# Patient Record
Sex: Female | Born: 1955 | Race: White | Hispanic: No | Marital: Married | Smoking: Never smoker
Health system: Southern US, Community
[De-identification: ages and names within clinical notes are randomized; demographics above are authoritative.]

## PROBLEM LIST (undated history)

## (undated) DIAGNOSIS — T7840XA Allergy, unspecified, initial encounter: Secondary | ICD-10-CM

## (undated) DIAGNOSIS — I011 Acute rheumatic endocarditis: Secondary | ICD-10-CM

## (undated) DIAGNOSIS — M810 Age-related osteoporosis without current pathological fracture: Secondary | ICD-10-CM

## (undated) DIAGNOSIS — L52 Erythema nodosum: Secondary | ICD-10-CM

## (undated) DIAGNOSIS — M1990 Unspecified osteoarthritis, unspecified site: Secondary | ICD-10-CM

## (undated) HISTORY — DX: Unspecified osteoarthritis, unspecified site: M19.90

## (undated) HISTORY — DX: Acute rheumatic endocarditis: I01.1

## (undated) HISTORY — PX: TUBAL LIGATION: SHX77

## (undated) HISTORY — DX: Age-related osteoporosis without current pathological fracture: M81.0

## (undated) HISTORY — DX: Erythema nodosum: L52

## (undated) HISTORY — DX: Allergy, unspecified, initial encounter: T78.40XA

---
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
## 1979-07-28 DIAGNOSIS — L52 Erythema nodosum: Secondary | ICD-10-CM

## 1979-07-28 HISTORY — DX: Erythema nodosum: L52

## 2001-11-26 HISTORY — PX: CERVICAL POLYPECTOMY: SHX88

## 2002-02-10 ENCOUNTER — Other Ambulatory Visit: Admission: RE | Admit: 2002-02-10 | Discharge: 2002-02-10 | Payer: Self-pay | Admitting: *Deleted

## 2005-10-09 ENCOUNTER — Other Ambulatory Visit: Admission: RE | Admit: 2005-10-09 | Discharge: 2005-10-09 | Payer: Self-pay | Admitting: Obstetrics and Gynecology

## 2006-01-03 ENCOUNTER — Encounter: Admission: RE | Admit: 2006-01-03 | Discharge: 2006-01-03 | Payer: Self-pay | Admitting: Gastroenterology

## 2006-10-31 ENCOUNTER — Other Ambulatory Visit: Admission: RE | Admit: 2006-10-31 | Discharge: 2006-10-31 | Payer: Self-pay | Admitting: Obstetrics & Gynecology

## 2009-08-10 ENCOUNTER — Encounter: Admission: RE | Admit: 2009-08-10 | Discharge: 2009-08-10 | Payer: Self-pay | Admitting: Obstetrics & Gynecology

## 2009-08-17 ENCOUNTER — Emergency Department (HOSPITAL_COMMUNITY): Admission: EM | Admit: 2009-08-17 | Discharge: 2009-08-17 | Payer: Self-pay | Admitting: Emergency Medicine

## 2012-10-17 ENCOUNTER — Other Ambulatory Visit: Payer: Self-pay | Admitting: Obstetrics & Gynecology

## 2012-10-17 DIAGNOSIS — Z1231 Encounter for screening mammogram for malignant neoplasm of breast: Secondary | ICD-10-CM

## 2012-12-03 ENCOUNTER — Ambulatory Visit
Admission: RE | Admit: 2012-12-03 | Discharge: 2012-12-03 | Disposition: A | Discharge status: Home / Self Care | Payer: Commercial Managed Care - PPO | Source: Ambulatory Visit | Attending: Obstetrics & Gynecology | Admitting: Obstetrics & Gynecology

## 2012-12-03 DIAGNOSIS — Z1231 Encounter for screening mammogram for malignant neoplasm of breast: Secondary | ICD-10-CM

## 2013-04-09 ENCOUNTER — Encounter: Payer: Self-pay | Admitting: Nurse Practitioner

## 2013-04-09 ENCOUNTER — Telehealth: Payer: Self-pay | Admitting: Nurse Practitioner

## 2013-04-09 ENCOUNTER — Ambulatory Visit (INDEPENDENT_AMBULATORY_CARE_PROVIDER_SITE_OTHER): Payer: Commercial Managed Care - PPO | Admitting: Nurse Practitioner

## 2013-04-09 ENCOUNTER — Encounter: Payer: Self-pay | Admitting: *Deleted

## 2013-04-09 VITALS — BP 128/70 | HR 76 | Temp 97.9°F | Wt 146.0 lb

## 2013-04-09 DIAGNOSIS — N390 Urinary tract infection, site not specified: Secondary | ICD-10-CM

## 2013-04-09 DIAGNOSIS — N952 Postmenopausal atrophic vaginitis: Secondary | ICD-10-CM

## 2013-04-09 LAB — POCT URINALYSIS DIPSTICK
Leukocytes, UA: NEGATIVE
Urobilinogen, UA: NEGATIVE
pH, UA: 5.5

## 2013-04-09 MED ORDER — ESTRADIOL 0.1 MG/GM VA CREA: TOPICAL_CREAM | VAGINAL | Status: DC

## 2013-04-09 MED ORDER — SULFAMETHOXAZOLE-TRIMETHOPRIM 800-160 MG PO TABS: 1.0000 | ORAL_TABLET | Freq: Two times a day (BID) | ORAL | Status: DC

## 2013-04-09 NOTE — Telephone Encounter (Signed)
[facility] pharmacy called in regards to discrepancies with the directions; particularly dosages needed for the prescription. The inquiry pertains to a prescription written for patient Jill Lara. The provider for this patient is Patty.

## 2013-04-09 NOTE — Patient Instructions (Signed)
Urinary Tract Infection Urinary tract infections (UTIs) can develop anywhere along your urinary tract. Your urinary tract is your body's drainage system for removing wastes and extra water. Your urinary tract includes two kidneys, two ureters, a bladder, and a urethra. Your kidneys are a pair of bean-shaped organs. Each kidney is about the size of your fist. They are located below your ribs, one on each side of your spine. CAUSES Infections are caused by microbes, which are microscopic organisms, including fungi, viruses, and bacteria. These organisms are so small that they can only be seen through a microscope. Bacteria are the microbes that most commonly cause UTIs. SYMPTOMS  Symptoms of UTIs may vary by age and gender of the patient and by the location of the infection. Symptoms in young women typically include a frequent and intense urge to urinate and a painful, burning feeling in the bladder or urethra during urination. Older women and men are more likely to be tired, shaky, and weak and have muscle aches and abdominal pain. A fever may mean the infection is in your kidneys. Other symptoms of a kidney infection include pain in your back or sides below the ribs, nausea, and vomiting. DIAGNOSIS To diagnose a UTI, your caregiver will ask you about your symptoms. Your caregiver also will ask to provide a urine sample. The urine sample will be tested for bacteria and white blood cells. White blood cells are made by your body to help fight infection. TREATMENT  Typically, UTIs can be treated with medication. Because most UTIs are caused by a bacterial infection, they usually can be treated with the use of antibiotics. The choice of antibiotic and length of treatment depend on your symptoms and the type of bacteria causing your infection. HOME CARE INSTRUCTIONS  If you were prescribed antibiotics, take them exactly as your caregiver instructs you. Finish the medication even if you feel better after you  have only taken some of the medication.  Drink enough water and fluids to keep your urine clear or pale yellow.  Avoid caffeine, tea, and carbonated beverages. They tend to irritate your bladder.  Empty your bladder often. Avoid holding urine for long periods of time.  Empty your bladder before and after sexual intercourse.  After a bowel movement, women should cleanse from front to back. Use each tissue only once. SEEK MEDICAL CARE IF:   You have back pain.  You develop a fever.  Your symptoms do not begin to resolve within 3 days. SEEK IMMEDIATE MEDICAL CARE IF:   You have severe back pain or lower abdominal pain.  You develop chills.  You have nausea or vomiting.  You have continued burning or discomfort with urination. MAKE SURE YOU:   Understand these instructions.  Will watch your condition.  Will get help right away if you are not doing well or get worse. Document Released: 08/22/2005 Document Revised: 05/13/2012 Document Reviewed: 12/21/2011 Exit[employer] Patient Information 2013 [employer].  

## 2013-04-09 NOTE — Progress Notes (Signed)
Subjective:     Patient ID: Jill Lara, female   DOB: [birth year], 57 y.o.   MRN: [id number]  Pelvic Pain The patient's primary symptoms include pelvic pain. Primary symptoms comment: Urinary dysuria and lower abdominal pressure since yesterday.. The current episode started yesterday. Associated symptoms include dysuria and frequency. Pertinent negatives include no chills, constipation, diarrhea or fever. Associated symptoms comments: Past week some loose stools but not diarrhea. Last sexually active 3-4 days ago. Recent increase in vaginal dryness with dyspareunia. Uses Estrace vaginal cream prn but currently out of med's.. She has tried NSAIDs for the symptoms. She is sexually active. She is postmenopausal.     Review of Systems  Constitutional: Negative.  Negative for fever and chills.  Respiratory:       Recent URI   Cardiovascular: Negative.   Gastrointestinal: Negative.  Negative for diarrhea and constipation.  Genitourinary: Positive for dysuria, frequency and pelvic pain.       No vaginal discharge.  Most recently out of vaginal estrogen cream.  Request a new RX.  Musculoskeletal: Negative.   Skin: Negative.   Neurological: Negative.   Psychiatric/Behavioral: Negative.        Objective:   Physical Exam  Constitutional: She is oriented to person, place, and time. She appears well-developed and well-nourished.  Cardiovascular: Normal rate.   Pulmonary/Chest: Effort normal.  Abdominal: Soft. She exhibits no distension and no mass. There is no tenderness. There is no rebound and no guarding.  Genitourinary:  Denies vaginal symptoms or discharge.  Neurological: She is alert and oriented to person, place, and time.  Skin: Skin is warm and dry.  Psychiatric: She has a normal mood and affect. Her behavior is normal. Judgment and thought content normal.   Urine is cloudy, otherwise negative    Assessment:     R/O UTI History of allergy to Macrobid and Cipro History of  atrophic vaginitis    Plan:     Septra DS 1 tab bid for 1 week. If culture is positive - return of TOC Refill Estrace  Vaginal cream to restart at 1 gm 2 X week, then may reduce to 1/2 gm 2 x week.

## 2013-04-12 LAB — URINE CULTURE: Colony Count: >=100000

## 2013-04-13 ENCOUNTER — Telehealth: Payer: Self-pay | Admitting: *Deleted

## 2013-04-13 NOTE — Telephone Encounter (Signed)
Message copied by Osie Bond on Mon Apr 13, 2013  4:50 PM ------      Message from: Ria Comment R      Created: Mon Apr 13, 2013  8:20 AM       Let patient know that she is on the correct antibiotic and needs TOC in 2 wks. Lab visit only is OK for her. ------

## 2013-04-13 NOTE — Telephone Encounter (Signed)
[facility] called and given correct quantity of prescription for Bactrim one tablet po BID x 7 days per Ulice Bold, FNP

## 2013-04-13 NOTE — Telephone Encounter (Signed)
Pt is aware of urine culture results and has TOC appt on 05/01/2013.

## 2013-04-13 NOTE — Progress Notes (Signed)
Encounter reviewed by Dr. Brook Silva.  

## 2013-05-01 ENCOUNTER — Other Ambulatory Visit: Payer: Self-pay | Admitting: Nurse Practitioner

## 2013-05-01 ENCOUNTER — Other Ambulatory Visit (INDEPENDENT_AMBULATORY_CARE_PROVIDER_SITE_OTHER): Payer: Commercial Managed Care - PPO

## 2013-05-01 DIAGNOSIS — N390 Urinary tract infection, site not specified: Secondary | ICD-10-CM

## 2013-05-03 LAB — URINE CULTURE

## 2013-05-04 ENCOUNTER — Telehealth: Payer: Self-pay | Admitting: *Deleted

## 2013-05-04 NOTE — Telephone Encounter (Signed)
Message copied by Osie Bond on Mon May 04, 2013  9:47 AM ------      Message from: Ria Comment R      Created: Mon May 04, 2013  8:25 AM       Let patient know negative.  If better she can stop Septra. Does not need TOC ------

## 2013-05-04 NOTE — Telephone Encounter (Signed)
Pt is aware of negative urine culture results and states she is currently not having any symptoms.

## 2013-10-12 ENCOUNTER — Ambulatory Visit: Payer: Self-pay | Admitting: Nurse Practitioner

## 2013-11-04 ENCOUNTER — Ambulatory Visit: Payer: Commercial Managed Care - PPO | Admitting: Nurse Practitioner

## 2013-12-22 ENCOUNTER — Ambulatory Visit: Payer: Self-pay

## 2013-12-22 ENCOUNTER — Ambulatory Visit (HOSPITAL_COMMUNITY)
Admission: RE | Admit: 2013-12-22 | Discharge: 2013-12-22 | Disposition: A | Discharge status: Home / Self Care | Payer: Self-pay | Source: Ambulatory Visit | Attending: Family Medicine | Admitting: Family Medicine

## 2013-12-22 ENCOUNTER — Ambulatory Visit: Payer: Self-pay | Admitting: Family Medicine

## 2013-12-22 VITALS — BP 122/76 | HR 78 | Temp 99.3°F | Resp 20 | Ht 67.0 in | Wt 171.0 lb

## 2013-12-22 DIAGNOSIS — R0602 Shortness of breath: Secondary | ICD-10-CM

## 2013-12-22 DIAGNOSIS — R599 Enlarged lymph nodes, unspecified: Secondary | ICD-10-CM | POA: Insufficient documentation

## 2013-12-22 DIAGNOSIS — R0902 Hypoxemia: Secondary | ICD-10-CM

## 2013-12-22 DIAGNOSIS — M549 Dorsalgia, unspecified: Secondary | ICD-10-CM | POA: Insufficient documentation

## 2013-12-22 DIAGNOSIS — J189 Pneumonia, unspecified organism: Secondary | ICD-10-CM | POA: Insufficient documentation

## 2013-12-22 DIAGNOSIS — R059 Cough, unspecified: Secondary | ICD-10-CM

## 2013-12-22 DIAGNOSIS — J90 Pleural effusion, not elsewhere classified: Secondary | ICD-10-CM | POA: Insufficient documentation

## 2013-12-22 DIAGNOSIS — R091 Pleurisy: Secondary | ICD-10-CM

## 2013-12-22 DIAGNOSIS — R05 Cough: Secondary | ICD-10-CM

## 2013-12-22 LAB — POCT CBC
GRANULOCYTE PERCENT: 75.3 % (ref 37–80)
HEMATOCRIT: 42.8 % (ref 37.7–47.9)
HEMOGLOBIN: 13.6 g/dL (ref 12.2–16.2)
Lymph, poc: 1.7 (ref 0.6–3.4)
MCH, POC: 28.8 pg (ref 27–31.2)
MCHC: 31.8 g/dL (ref 31.8–35.4)
MCV: 90.6 fL (ref 80–97)
MID (cbc): 0.7 (ref 0–0.9)
MPV: 8.3 fL (ref 0–99.8)
POC GRANULOCYTE: 7.1 — AB (ref 2–6.9)
POC LYMPH %: 17.7 % (ref 10–50)
POC MID %: 7 % (ref 0–12)
Platelet Count, POC: 370 10*3/uL (ref 142–424)
RBC: 4.72 M/uL (ref 4.04–5.48)
RDW, POC: 12.9 %
WBC: 9.4 10*3/uL (ref 4.6–10.2)

## 2013-12-22 MED ORDER — HYDROCODONE-ACETAMINOPHEN 10-325 MG PO TABS: 1.0000 | ORAL_TABLET | Freq: Three times a day (TID) | ORAL | Status: DC | PRN

## 2013-12-22 MED ORDER — INDOMETHACIN 25 MG PO CAPS: 25.0000 mg | ORAL_CAPSULE | Freq: Three times a day (TID) | ORAL | Status: DC

## 2013-12-22 MED ORDER — AZITHROMYCIN 250 MG PO TABS: ORAL_TABLET | ORAL | Status: DC

## 2013-12-22 MED ORDER — IOHEXOL 350 MG/ML SOLN: 100.0000 mL | Freq: Once | INTRAVENOUS | Status: AC | PRN

## 2013-12-22 NOTE — Progress Notes (Addendum)
Subjective: 58 year old lady who is generally healthy. For 5 days ago she had a little sore throat. She developed some hurting in her chest when she would take a deep breath or cough. It seemed to hurt in the right low back. She may have had a little congestion. Sore throat is gone away. She feels the pain most when she takes a deep breath or coughs, it just catches her in the right mid back. She does not smoke. She is not coughing up anything major. No nausea or vomiting. No calf pain. No chest or abdominal surgeries. She did not get a flu shot this year. No history of chest wall trauma  Objective: Pleasant healthy-appearing lady in no major distress when she is sitting still. TMs normal. Throat clear. Neck supple without nodes. Chest is clear to auscultation but when she tries to take a deep breath she has a catch and does not progress on into a good full deep breath due to the pain in the right lower chest posteriorly. No chest wall tenderness. Possible minimal dullness on percussion. Heart regular without murmurs. Abdomen soft and nontender. No calf tenderness negative Homans.  Assessment: Pleuritic right lower chest pain posteriorly  Plan: Chest x-ray CBC  Results for orders placed in visit on 12/22/13  POCT CBC      Result Value Range   WBC 9.4  4.6 - 10.2 K/uL   Lymph, poc 1.7  0.6 - 3.4   POC LYMPH PERCENT 17.7  10 - 50 %L   MID (cbc) 0.7  0 - 0.9   POC MID % 7.0  0 - 12 %M   POC Granulocyte 7.1 (*) 2 - 6.9   Granulocyte percent 75.3  37 - 80 %G   RBC 4.72  4.04 - 5.48 M/uL   Hemoglobin 13.6  12.2 - 16.2 g/dL   HCT, POC 42.8  37.7 - 47.9 %   MCV 90.6  80 - 97 fL   MCH, POC 28.8  27 - 31.2 pg   MCHC 31.8  31.8 - 35.4 g/dL   RDW, POC 12.9     Platelet Count, POC 370  142 - 424 K/uL   MPV 8.3  0 - 99.8 fL   [facility] reading (PRIMARY) by  Dr. Rise Paganini area of infiltrate right upper lung central Prominent markings right lower lung Tiny bit of blunting right costophrenic  angle  Will send for lung scan to rule out pulmonary embolus with regard to the hypoxemia, infiltrates, and pleural nature of the pain. However her other symptoms sounded like she had a viral infection, and I still suspect is going to turn out to be an acute viral pleurisy..  Lung scan was normal but there are multiple focal areas of pneumonia, more than seen on regular chest x-ray. I decided to change the antibiotic to Levaquin. The patient is supposed come back for a recheck tomorrow. Assistant is to call the patient sensed the radiologist could not put me through to the patient. I think she ended up having to leave a message since the patient's cell phone was not on.

## 2013-12-22 NOTE — Patient Instructions (Addendum)
Lung scan will be done to rule out a blood clot  Take Zithromax as directed  Take indomethacin 25 mg one tablet  3 times daily with food for inflammation  Take hydrocodone for pain every 4 hours as needed  Return for recheck tomorrow morning

## 2013-12-23 ENCOUNTER — Telehealth: Payer: Self-pay

## 2013-12-23 NOTE — Telephone Encounter (Signed)
Patient is not coming in today.  Her medication is working for her.  She wanted Dr. Linna Darner and Morey Hummingbird to know she is fine, and will call back if she backslides.   [phone]

## 2013-12-24 NOTE — Telephone Encounter (Signed)
noted 

## 2013-12-25 ENCOUNTER — Telehealth: Payer: Self-pay

## 2013-12-25 NOTE — Telephone Encounter (Signed)
Advised pt to RTC if symptoms are not improved. Pt agrees to come in for a follow up around Feb 28.

## 2013-12-25 NOTE — Telephone Encounter (Signed)
PATIENT HAS QUESTIONS ABOUT HER CHEST XRAY WOULD LIKE DR HOPPER TO CALL HER AT [phone]

## 2014-01-01 NOTE — Telephone Encounter (Signed)
She does not have an upcoming appointment, she is to see Dr Linna Darner at 102. Called her, what is the reaction? No answer. Please try again later.

## 2014-01-01 NOTE — Telephone Encounter (Signed)
PT HAD AN ADVERSE REACTION TO ANITBIOTICS PRESCRIBED BY DR. HOPPER. SHE WANTED TO SPEAK TO SOMEONE IN REGARDS TO THIS AND AN UPCOMING APPT. THANKS!!

## 2014-01-04 ENCOUNTER — Encounter: Payer: Self-pay | Admitting: Family Medicine

## 2014-01-04 NOTE — Telephone Encounter (Signed)
Spoke with patient, she took 5 days of the levaquin. States she has has what the orthopedic doctor calls an adverse reaction. She has bilateral bicep tears and a tear in left hand. She plans to come back on feb. 28th.

## 2014-01-04 NOTE — Telephone Encounter (Signed)
Patient called me back and we discussed the bilateral shoulder tendinopathy and rupture problems. The orthopedist will follow her. If her pneumonia gives her more trouble she is to come back and see me. She plans to come back and of the month anyhow.

## 2014-01-04 NOTE — Telephone Encounter (Signed)
I called the patient and left a message on her machine. I'm sorry to hear that she has the problems from the tendinopathy caused by the Levaquin.. I've heard of that happening but have not ever had it in my own experience. Told her orthopedist we'll obviously have to be cared for this, but if she needs me to give me a call back. Understand that she is planning to come back in anyhow.

## 2014-01-15 ENCOUNTER — Ambulatory Visit: Payer: Self-pay

## 2014-01-15 ENCOUNTER — Ambulatory Visit: Payer: Self-pay | Admitting: Family Medicine

## 2014-01-15 VITALS — BP 120/78 | HR 121 | Temp 98.0°F | Resp 16 | Ht 65.5 in | Wt 169.2 lb

## 2014-01-15 DIAGNOSIS — J189 Pneumonia, unspecified organism: Secondary | ICD-10-CM

## 2014-01-15 DIAGNOSIS — R05 Cough: Secondary | ICD-10-CM

## 2014-01-15 DIAGNOSIS — M719 Bursopathy, unspecified: Secondary | ICD-10-CM

## 2014-01-15 DIAGNOSIS — R700 Elevated erythrocyte sedimentation rate: Secondary | ICD-10-CM

## 2014-01-15 DIAGNOSIS — R9389 Abnormal findings on diagnostic imaging of other specified body structures: Secondary | ICD-10-CM

## 2014-01-15 DIAGNOSIS — R091 Pleurisy: Secondary | ICD-10-CM

## 2014-01-15 DIAGNOSIS — R0602 Shortness of breath: Secondary | ICD-10-CM

## 2014-01-15 DIAGNOSIS — M6790 Unspecified disorder of synovium and tendon, unspecified site: Secondary | ICD-10-CM

## 2014-01-15 DIAGNOSIS — R059 Cough, unspecified: Secondary | ICD-10-CM

## 2014-01-15 DIAGNOSIS — R0902 Hypoxemia: Secondary | ICD-10-CM

## 2014-01-15 LAB — POCT CBC
GRANULOCYTE PERCENT: 79.2 % (ref 37–80)
HEMATOCRIT: 38.8 % (ref 37.7–47.9)
HEMOGLOBIN: 12.2 g/dL (ref 12.2–16.2)
Lymph, poc: 1.7 (ref 0.6–3.4)
MCH, POC: 27.9 pg (ref 27–31.2)
MCHC: 31.4 g/dL — AB (ref 31.8–35.4)
MCV: 88.9 fL (ref 80–97)
MID (CBC): 0.9 (ref 0–0.9)
MPV: 6.9 fL (ref 0–99.8)
POC GRANULOCYTE: 10 — AB (ref 2–6.9)
POC LYMPH %: 13.3 % (ref 10–50)
POC MID %: 7.5 % (ref 0–12)
Platelet Count, POC: 632 10*3/uL — AB (ref 142–424)
RBC: 4.37 M/uL (ref 4.04–5.48)
RDW, POC: 12.9 %
WBC: 12.6 10*3/uL — AB (ref 4.6–10.2)

## 2014-01-15 LAB — POCT SEDIMENTATION RATE: POCT SED RATE: 116 mm/h — AB (ref 0–22)

## 2014-01-15 MED ORDER — INDOMETHACIN 25 MG PO CAPS: 25.0000 mg | ORAL_CAPSULE | Freq: Three times a day (TID) | ORAL | Status: DC

## 2014-01-15 MED ORDER — AZITHROMYCIN 250 MG PO TABS: ORAL_TABLET | ORAL | Status: DC

## 2014-01-15 NOTE — Progress Notes (Signed)
Subjective:  Jill Lara developed tendinopathy is for 5 days after being placed on the Levaquin. She went to the orthopedic urgent care when the orthopedic doctor made the diagnosis as being Levaquin related. Subsequent to that she is seeing at least one of the orthopedist who didn't think he ever seen that caused by Levaquin. She has tendinopathy in her right shoulder and biceps area in the left shoulder and biceps area has developed a decreased drains contracture in the left hand, and hurts in both hands. Lower extremities are okay. She continues to cough and feel tight in her lungs. The CT scan showed a multifocal pneumonia. She's not been running any fever.  Objective: Pleasant lady accompanied by her her husband her throat is clear. Neck supple without nodes. Chest clear to auscultation. Heart regular without murmurs. Shoulders are both tender down to the biceps areas. Left hand has the complaints contractures noted. Both wrists and painful.  [facility] reading (PRIMARY) by  Dr. Linna Lara Minimal improvement of patchy rul infiltrate.  Right pleural effusion, small  Results for orders placed in visit on 01/15/14  POCT CBC      Result Value Ref Range   WBC 12.6 (*) 4.6 - 10.2 K/uL   Lymph, poc 1.7  0.6 - 3.4   POC LYMPH PERCENT 13.3  10 - 50 %L   MID (cbc) 0.9  0 - 0.9   POC MID % 7.5  0 - 12 %M   POC Granulocyte 10.0 (*) 2 - 6.9   Granulocyte percent 79.2  37 - 80 %G   RBC 4.37  4.04 - 5.48 M/uL   Hemoglobin 12.2  12.2 - 16.2 g/dL   HCT, POC 38.8  37.7 - 47.9 %   MCV 88.9  80 - 97 fL   MCH, POC 27.9  27 - 31.2 pg   MCHC 31.4 (*) 31.8 - 35.4 g/dL   RDW, POC 12.9     Platelet Count, POC 632 (*) 142 - 424 K/uL   MPV 6.9  0 - 99.8 fL   Assessment: Atypical pneumonia 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  Consider pulmonary referral PT referral

## 2014-01-15 NOTE — Patient Instructions (Signed)
Is being made to physical therapy. I spoke to the [place] [facility] physical therapy at [facility] 2:30 PM  Wednesday   Referral will probably be made to rheumatology.  Labs are pending  Return in 10-14 days.

## 2014-01-16 LAB — ANGIOTENSIN CONVERTING ENZYME: Angiotensin-Converting Enzyme: 21 U/L (ref 8–52)

## 2014-01-17 LAB — COMPLETE METABOLIC PANEL WITH GFR
ALK PHOS: 108 U/L (ref 39–117)
ALT: 17 U/L (ref 0–35)
AST: 21 U/L (ref 0–37)
Albumin: 3.7 g/dL (ref 3.5–5.2)
BUN: 19 mg/dL (ref 6–23)
CALCIUM: 9 mg/dL (ref 8.4–10.5)
CO2: 24 meq/L (ref 19–32)
CREATININE: 0.7 mg/dL (ref 0.50–1.10)
Chloride: 101 mEq/L (ref 96–112)
GFR, Est Non African American: >89 mL/min
Glucose, Bld: 106 mg/dL — ABNORMAL HIGH (ref 70–99)
Potassium: 4.8 mEq/L (ref 3.5–5.3)
Sodium: 138 mEq/L (ref 135–145)
TOTAL PROTEIN: 7.3 g/dL (ref 6.0–8.3)
Total Bilirubin: 0.3 mg/dL (ref 0.2–1.2)

## 2014-01-17 NOTE — Addendum Note (Signed)
Addended by: Constance Goltz on: 01/17/2014 10:25 AM   Modules accepted: Orders

## 2014-01-18 LAB — ANA: Anti Nuclear Antibody(ANA): NEGATIVE

## 2014-01-20 ENCOUNTER — Ambulatory Visit: Payer: Self-pay | Attending: Family Medicine | Admitting: Physical Therapy

## 2014-01-20 DIAGNOSIS — IMO0001 Reserved for inherently not codable concepts without codable children: Secondary | ICD-10-CM | POA: Insufficient documentation

## 2014-01-20 DIAGNOSIS — M25519 Pain in unspecified shoulder: Secondary | ICD-10-CM | POA: Insufficient documentation

## 2014-01-21 ENCOUNTER — Telehealth: Payer: Self-pay | Admitting: Family Medicine

## 2014-01-21 ENCOUNTER — Encounter: Payer: Self-pay | Admitting: Physical Therapy

## 2014-01-21 ENCOUNTER — Institutional Professional Consult (permissible substitution): Payer: Self-pay | Admitting: Internal Medicine

## 2014-01-21 NOTE — Telephone Encounter (Signed)
Dr. Linna Darner, this patient is returning a phone call to you.   [phone]

## 2014-01-24 NOTE — Telephone Encounter (Signed)
Pt just wanted to let you know how she was doing.  She states that she is doing some better and will be by on Wednesday.

## 2014-01-25 ENCOUNTER — Ambulatory Visit: Payer: Commercial Managed Care - PPO | Admitting: Nurse Practitioner

## 2014-01-26 ENCOUNTER — Ambulatory Visit: Payer: Self-pay | Attending: Family Medicine | Admitting: Physical Therapy

## 2014-01-26 DIAGNOSIS — IMO0001 Reserved for inherently not codable concepts without codable children: Secondary | ICD-10-CM | POA: Insufficient documentation

## 2014-01-26 DIAGNOSIS — M25519 Pain in unspecified shoulder: Secondary | ICD-10-CM | POA: Insufficient documentation

## 2014-01-27 ENCOUNTER — Ambulatory Visit: Payer: Self-pay

## 2014-01-27 ENCOUNTER — Ambulatory Visit: Payer: Self-pay | Admitting: Family Medicine

## 2014-01-27 VITALS — BP 120/78 | HR 77 | Temp 98.0°F | Resp 16 | Ht 65.5 in | Wt 167.0 lb

## 2014-01-27 DIAGNOSIS — M6790 Unspecified disorder of synovium and tendon, unspecified site: Secondary | ICD-10-CM

## 2014-01-27 DIAGNOSIS — J189 Pneumonia, unspecified organism: Secondary | ICD-10-CM

## 2014-01-27 DIAGNOSIS — D649 Anemia, unspecified: Secondary | ICD-10-CM

## 2014-01-27 DIAGNOSIS — J90 Pleural effusion, not elsewhere classified: Secondary | ICD-10-CM

## 2014-01-27 DIAGNOSIS — M719 Bursopathy, unspecified: Secondary | ICD-10-CM

## 2014-01-27 LAB — POCT CBC
GRANULOCYTE PERCENT: 69.8 % (ref 37–80)
HEMATOCRIT: 34.6 % — AB (ref 37.7–47.9)
Hemoglobin: 11.1 g/dL — AB (ref 12.2–16.2)
LYMPH, POC: 2 (ref 0.6–3.4)
MCH, POC: 27.3 pg (ref 27–31.2)
MCHC: 32.1 g/dL (ref 31.8–35.4)
MCV: 85.3 fL (ref 80–97)
MID (cbc): 0.8 (ref 0–0.9)
MPV: 7.3 fL (ref 0–99.8)
PLATELET COUNT, POC: 582 10*3/uL — AB (ref 142–424)
POC GRANULOCYTE: 6.4 (ref 2–6.9)
POC LYMPH PERCENT: 21.9 %L (ref 10–50)
POC MID %: 8.3 % (ref 0–12)
RBC: 4.06 M/uL (ref 4.04–5.48)
RDW, POC: 13.1 %
WBC: 9.1 10*3/uL (ref 4.6–10.2)

## 2014-01-27 LAB — POCT SEDIMENTATION RATE: POCT SED RATE: 96 mm/h — AB (ref 0–22)

## 2014-01-27 NOTE — Patient Instructions (Addendum)
Take generic Aleve 2 pills twice daily  I recommend continuing to see the rheumatologist  If the chest x-ray report is good by the radiology reading we will cancel the pulmonology appointment  If worse at anytime return. You can return to work when physical therapy feels like it is safe for you to do so. If you need a note from me to let me know.  You have a mild anemia. Your hemoglobin should be above 12.2 and it was 11.1 today. I recommend that your blood count be rechecked one more time and 6-12 weeks.  Return sooner if you need to

## 2014-01-28 ENCOUNTER — Telehealth: Payer: Self-pay | Admitting: Family Medicine

## 2014-01-28 NOTE — Progress Notes (Signed)
Subjective: Patient is feeling better. Her arms are gradually getting less painful and stronger though she still does not feel safe to drive and cannot work yet. Her lungs feel better. No fevers.  Objective: Reviewed her reports with her. Her throat is clear. Neck supple without nodes. Chest clear. Heart regular without murmurs. Abdomen soft. Chest x-ray taken. CBC done.  Results for orders placed in visit on 01/27/14  POCT CBC      Result Value Ref Range   WBC 9.1  4.6 - 10.2 K/uL   Lymph, poc 2.0  0.6 - 3.4   POC LYMPH PERCENT 21.9  10 - 50 %L   MID (cbc) 0.8  0 - 0.9   POC MID % 8.3  0 - 12 %M   POC Granulocyte 6.4  2 - 6.9   Granulocyte percent 69.8  37 - 80 %G   RBC 4.06  4.04 - 5.48 M/uL   Hemoglobin 11.1 (*) 12.2 - 16.2 g/dL   HCT, POC 34.6 (*) 37.7 - 47.9 %   MCV 85.3  80 - 97 fL   MCH, POC 27.3  27 - 31.2 pg   MCHC 32.1  31.8 - 35.4 g/dL   RDW, POC 13.1     Platelet Count, POC 582 (*) 142 - 424 K/uL   MPV 7.3  0 - 99.8 fL  POCT SEDIMENTATION RATE      Result Value Ref Range   POCT SED RATE 96 (*) 0 - 22 mm/hr   [facility] reading (PRIMARY) by  Dr. Linna Darner Improved pneumonia, almost totally resolved.  Assessment: Atypical pneumonia, multifocal, improved Tendinopathy, slowly improving  Plan: Continue the physical therapy. I think she can safely cancel the pulmonary appointment unless the radiologist sees something major. We will have her keep the rheumatology appointment.Marland Kitchen

## 2014-01-28 NOTE — Telephone Encounter (Signed)
Patient would like to know results of CXR. See report.

## 2014-01-29 ENCOUNTER — Encounter: Payer: Self-pay | Admitting: Family Medicine

## 2014-01-29 NOTE — Telephone Encounter (Signed)
Call patient:  The radiologist agrees that lung is much improved, however is still concerned about a node in her chest area.  We have discussed this before.  Tell her options are either to keep the pulmonary appointment, or cancel it for now and let us repeat the CXR again in a couple of months.  I will be gone to [place] May 5-about 25th. So can come in either just before or just after that, probably before would be best.

## 2014-01-29 NOTE — Telephone Encounter (Signed)
Reviewed message with patient.  Patient states understanding and will let us know what she would like to do.

## 2014-02-02 ENCOUNTER — Institutional Professional Consult (permissible substitution): Payer: Self-pay | Admitting: Internal Medicine

## 2014-02-02 ENCOUNTER — Ambulatory Visit: Payer: Self-pay | Admitting: Physical Therapy

## 2014-02-09 ENCOUNTER — Ambulatory Visit: Payer: Self-pay | Admitting: Physical Therapy

## 2014-02-16 ENCOUNTER — Ambulatory Visit: Payer: Self-pay | Admitting: Physical Therapy

## 2014-02-23 ENCOUNTER — Ambulatory Visit: Payer: Self-pay | Admitting: Physical Therapy

## 2014-03-02 ENCOUNTER — Ambulatory Visit: Payer: Self-pay | Attending: Family Medicine | Admitting: Physical Therapy

## 2014-03-02 DIAGNOSIS — IMO0001 Reserved for inherently not codable concepts without codable children: Secondary | ICD-10-CM | POA: Insufficient documentation

## 2014-03-02 DIAGNOSIS — M25519 Pain in unspecified shoulder: Secondary | ICD-10-CM | POA: Insufficient documentation

## 2014-03-09 ENCOUNTER — Ambulatory Visit: Payer: Self-pay | Admitting: Physical Therapy

## 2014-03-16 ENCOUNTER — Ambulatory Visit: Payer: Self-pay | Admitting: Physical Therapy

## 2014-03-22 ENCOUNTER — Encounter (HOSPITAL_COMMUNITY): Payer: Self-pay | Admitting: Emergency Medicine

## 2014-03-22 ENCOUNTER — Emergency Department (HOSPITAL_COMMUNITY)
Admission: EM | Admit: 2014-03-22 | Discharge: 2014-03-22 | Disposition: A | Discharge status: Home / Self Care | Payer: Self-pay | Attending: Emergency Medicine | Admitting: Emergency Medicine

## 2014-03-22 DIAGNOSIS — M79604 Pain in right leg: Secondary | ICD-10-CM

## 2014-03-22 DIAGNOSIS — M7989 Other specified soft tissue disorders: Secondary | ICD-10-CM

## 2014-03-22 DIAGNOSIS — M660 Rupture of popliteal cyst: Secondary | ICD-10-CM

## 2014-03-22 DIAGNOSIS — M7120 Synovial cyst of popliteal space [Baker], unspecified knee: Secondary | ICD-10-CM | POA: Insufficient documentation

## 2014-03-22 DIAGNOSIS — Z872 Personal history of diseases of the skin and subcutaneous tissue: Secondary | ICD-10-CM | POA: Insufficient documentation

## 2014-03-22 DIAGNOSIS — Z791 Long term (current) use of non-steroidal anti-inflammatories (NSAID): Secondary | ICD-10-CM | POA: Insufficient documentation

## 2014-03-22 DIAGNOSIS — Z79899 Other long term (current) drug therapy: Secondary | ICD-10-CM | POA: Insufficient documentation

## 2014-03-22 LAB — I-STAT CHEM 8, ED
BUN: 18 mg/dL (ref 6–23)
CALCIUM ION: 1.16 mmol/L (ref 1.12–1.23)
Chloride: 105 mEq/L (ref 96–112)
Creatinine, Ser: 0.6 mg/dL (ref 0.50–1.10)
GLUCOSE: 124 mg/dL — AB (ref 70–99)
HEMATOCRIT: 31 % — AB (ref 36.0–46.0)
HEMOGLOBIN: 10.5 g/dL — AB (ref 12.0–15.0)
POTASSIUM: 3.8 meq/L (ref 3.7–5.3)
Sodium: 144 mEq/L (ref 137–147)
TCO2: 24 mmol/L (ref 0–100)

## 2014-03-22 NOTE — Progress Notes (Signed)
*  Preliminary Results* Right lower extremity venous duplex completed. Right lower extremity is negative for deep vein thrombosis. There is a right Baker's cyst measuring 3.7cm. Additionally, there is a a complex, hypoechoic area of the medial knee to proximal/mid medial calf, suggestive of a possible ruptured Baker's cyst versus muscle tear.  Preliminary results discussed with Dr.Goldston.  03/22/2014 9:30 AM  Maudry Mayhew, RVT, RDCS, RDMS

## 2014-03-22 NOTE — ED Notes (Signed)
Per pt, states she had a reaction to Levaquin, tendonopathy?-states right leg swollen and painful since Sat

## 2014-03-22 NOTE — ED Provider Notes (Signed)
CSN: [id number]     Arrival date & time 03/22/14  8756 History   First MD Initiated Contact with Patient 03/22/14 0845     Chief Complaint  Patient presents with  . Leg Pain     (Consider location/radiation/quality/duration/timing/severity/associated sxs/prior Treatment) HPI 58 year old female presents with 3 days of right lower leg pain. She states she has pain in the next day noticed swelling in her calf. She states it feels tight. She rates the pain as an 8/10 describes as a throbbing pain. At first she thought it was from overuse she's been going to physical therapy last couple months for shoulder tendinopathy bilaterally from Levaquin. Her lower tremors were never involved. She has no pain over her Achilles. She denies any numbness or tingling. She's never had a blood clot. She's been taking Aleve and Tylenol intermittently for the pain. She declines wanting any pain medicine at this time. No chest pain or shortness of breath. No fevers. She took levaquin back in January and has been going to PT ever since for her shoulders.  Past Medical History  Diagnosis Date  . 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 Tubal ligation      BTL  . Tonselectomy      at age 13  . Cervical polypectomy  2003   Family History  Problem Relation Age of Onset  . Heart disease Mother   . Hyperlipidemia Brother   . Heart disease Maternal Grandmother   . Cancer - Lung Maternal Grandfather   . Brain cancer Paternal Grandmother    History  Substance Use Topics  . Smoking status: Never Smoker   . Smokeless tobacco: Never Used  . Alcohol Use: 1.2 oz/week    1 Glasses of wine, 1 Cans of beer per week   OB History   Grav Para Term Preterm Abortions TAB SAB Ect Mult Living   2 1   1  1   1      Review of Systems  Constitutional: Negative for fever.  Respiratory: Negative for shortness of breath.   Cardiovascular: Positive for leg swelling. Negative for chest  pain.  Gastrointestinal: Negative for vomiting.  Musculoskeletal: Negative for joint swelling.  Skin: Negative for wound.  Neurological: Negative for weakness and numbness.  All other systems reviewed and are negative.     Allergies  Levaquin; Macrobid; and Ciprofloxacin  Home Medications   Prior to Admission medications   Medication Sig Start Date End Date Taking? Authorizing Provider  acetaminophen (TYLENOL) 325 MG tablet Take 650 mg by mouth every 6 (six) hours as needed.    Historical Provider, MD  azithromycin (ZITHROMAX) 250 MG tablet Take 2 pills initially, then one daily for 4 days for infection 01/15/14   Posey Boyer, MD  diclofenac sodium (VOLTAREN) 1 % GEL Apply topically 4 (four) times daily.    Historical Provider, MD  estradiol (ESTRACE) 0.1 MG/GM vaginal cream Use 1/2 g vaginally every night for the first 2 weeks, then use 1/2 g vaginally two or three times per week as needed to maintain symptom relief. 04/09/13   Milford Cage, FNP  HYDROcodone-acetaminophen (NORCO) 10-325 MG per tablet Take 1 tablet by mouth every 8 (eight) hours as needed. 12/22/13   Posey Boyer, MD  Ibuprofen (MOTRIN PO) Take by mouth.    Historical Provider, MD  indomethacin (INDOCIN) 25 MG capsule Take 1 capsule (25 mg total) by mouth 3 (three) times daily with meals. 01/15/14  Posey Boyer, MD  sulfamethoxazole-trimethoprim (BACTRIM DS) 800-160 MG per tablet Take 1 tablet by mouth 2 (two) times daily. One PO BID x 3 days 04/09/13   Mardene Celeste Rolen-Grubb, FNP   BP 128/85  Pulse 85  Temp(Src) 97.7 F (36.5 C) (Oral)  Resp 16  SpO2 100% Physical Exam  Nursing note and vitals reviewed. Constitutional: She is oriented to person, place, and time. She appears well-developed and well-nourished. No distress.  HENT:  Head: Normocephalic and atraumatic.  Right Ear: External ear normal.  Left Ear: External ear normal.  Nose: Nose normal.  Eyes: Right eye exhibits no discharge. Left eye  exhibits no discharge.  Cardiovascular: Normal rate, regular rhythm and normal heart sounds.   Pulses:      Dorsalis pedis pulses are 2+ on the right side.       Posterior tibial pulses are 2+ on the right side.  Pulmonary/Chest: Effort normal and breath sounds normal.  Abdominal: She exhibits no distension.  Musculoskeletal:       Right knee: Tenderness (behind knee) found.       Right upper leg: She exhibits no tenderness, no bony tenderness and no swelling.       Right lower leg: She exhibits tenderness (posteriorly) and swelling. She exhibits no bony tenderness.       Legs: Neurological: She is alert and oriented to person, place, and time.  Normal strength in her lower extremities  Skin: Skin is warm and dry.    ED Course  Procedures (including critical care time) Labs Review Labs Reviewed  I-STAT CHEM 8, ED    Imaging Review  Right lower Extremity Ultrasound *Preliminary Results*  Right lower extremity venous duplex completed.  Right lower extremity is negative for deep vein thrombosis. There is a right Baker's cyst measuring 3.7cm. Additionally, there is a a complex, hypoechoic area of the medial knee to proximal/mid medial calf, suggestive of a possible ruptured Baker's cyst versus muscle tear.  Preliminary results discussed with Dr.Riannah Stagner.  03/22/2014 9:30 AM  Maudry Mayhew, RVT, RDCS, RDMS   EKG Interpretation None      MDM   Final diagnoses:  Baker's cyst, ruptured  Right leg pain    Ultrasound negative for DVT. Does show Baker's cyst with rupture vs extensive muscle tear. She is NV intact and able to ambulate. D/w Dr. Percell Miller of ortho, recommends RICE therapy. Can have knee immobilizer for comfort, which patient declines at this time. She is fine with NSAIDs, and will follow up with ortho as outpatient.    Ephraim Hamburger, MD 03/22/14 1021

## 2014-03-22 NOTE — Discharge Instructions (Signed)
Baker Cyst  A Baker cyst is a sac-like structure that forms in the back of the knee. It is filled with the same fluid that is located in your knee. This fluid lubricates the bones and cartilage of the knee and allows them to move over each other more easily.  CAUSES   When the knee becomes injured or inflamed, increased fluid forms in the knee. When this happens, the joint lining is pushed out behind the knee and forms the Baker cyst. This cyst may also be caused by inflammation from arthritic conditions and infections.  SIGNS AND SYMPTOMS   A Baker cyst usually has no symptoms. When the cyst is substantially enlarged:  · You may feel pressure behind the knee, stiffness in the knee, or a mass in the area behind the knee.  · You may develop pain, redness, and swelling in the calf.  This can suggest a blood clot and requires evaluation by your health care provider.  DIAGNOSIS   A Baker cyst is most often found during an ultrasound exam. This exam may have been performed for other reasons, and the cyst was found incidentally. Sometimes an MRI is used. This picks up other problems within a joint that an ultrasound exam may not. If the Baker cyst developed immediately after an injury, X-ray exams may be used to diagnose the cyst.  TREATMENT   The treatment depends on the cause of the cyst. Anti-inflammatory medicines and rest often will be prescribed. If the cyst is caused by a bacterial infection, antibiotic medicines may be prescribed.   HOME CARE INSTRUCTIONS   · If the cyst was caused by an injury, for the first 24 hours, keep the injured leg elevated on 2 pillows while lying down.  · For the first 24 hours while you are awake, apply ice to the injured area:  · Put ice in a plastic bag.  · Place a towel between your skin and the bag.  · Leave the ice on for 20 minutes, 2 3 times a day.  · Only take over-the-counter or prescription medicines for pain, discomfort, or fever as directed by your health care  provider.  · Only take antibiotic medicine as directed. Make sure to finish it even if you start to feel better.  MAKE SURE YOU:   · Understand these instructions.  · Will watch your condition.  · Will get help right away if you are not doing well or get worse.  Document Released: 11/12/2005 Document Revised: 09/02/2013 Document Reviewed: 06/24/2013  ExitCare® Patient Information ©2014 [employer].

## 2014-03-23 ENCOUNTER — Ambulatory Visit: Payer: Self-pay | Admitting: Physical Therapy

## 2014-03-30 ENCOUNTER — Ambulatory Visit: Payer: Self-pay | Attending: Family Medicine | Admitting: Physical Therapy

## 2014-03-30 DIAGNOSIS — M25519 Pain in unspecified shoulder: Secondary | ICD-10-CM | POA: Insufficient documentation

## 2014-03-30 DIAGNOSIS — IMO0001 Reserved for inherently not codable concepts without codable children: Secondary | ICD-10-CM | POA: Insufficient documentation

## 2014-04-06 ENCOUNTER — Ambulatory Visit: Payer: Self-pay | Admitting: Physical Therapy

## 2014-04-13 ENCOUNTER — Ambulatory Visit: Payer: Self-pay | Admitting: Physical Therapy

## 2014-04-20 ENCOUNTER — Ambulatory Visit: Payer: Self-pay | Admitting: Physical Therapy

## 2014-04-27 ENCOUNTER — Ambulatory Visit: Payer: Self-pay | Attending: Family Medicine | Admitting: Physical Therapy

## 2014-04-27 DIAGNOSIS — IMO0001 Reserved for inherently not codable concepts without codable children: Secondary | ICD-10-CM | POA: Insufficient documentation

## 2014-04-27 DIAGNOSIS — M25519 Pain in unspecified shoulder: Secondary | ICD-10-CM | POA: Insufficient documentation

## 2014-05-04 ENCOUNTER — Ambulatory Visit: Payer: Self-pay | Admitting: Physical Therapy

## 2014-05-11 ENCOUNTER — Ambulatory Visit: Payer: Self-pay | Admitting: Physical Therapy

## 2014-05-18 ENCOUNTER — Ambulatory Visit: Payer: Self-pay | Admitting: Physical Therapy

## 2014-06-01 ENCOUNTER — Ambulatory Visit: Payer: Self-pay | Admitting: Physical Therapy

## 2014-06-08 ENCOUNTER — Ambulatory Visit: Payer: Self-pay | Admitting: Physical Therapy

## 2014-06-09 ENCOUNTER — Ambulatory Visit: Payer: Self-pay | Attending: Family Medicine | Admitting: Physical Therapy

## 2014-06-09 DIAGNOSIS — IMO0001 Reserved for inherently not codable concepts without codable children: Secondary | ICD-10-CM | POA: Insufficient documentation

## 2014-06-09 DIAGNOSIS — M25519 Pain in unspecified shoulder: Secondary | ICD-10-CM | POA: Insufficient documentation

## 2014-06-23 ENCOUNTER — Ambulatory Visit: Payer: Self-pay | Admitting: Physical Therapy

## 2014-06-24 ENCOUNTER — Ambulatory Visit: Payer: Self-pay | Admitting: Physical Therapy

## 2014-08-18 ENCOUNTER — Telehealth: Payer: Self-pay

## 2014-08-18 DIAGNOSIS — M6790 Unspecified disorder of synovium and tendon, unspecified site: Secondary | ICD-10-CM

## 2014-08-18 DIAGNOSIS — G609 Hereditary and idiopathic neuropathy, unspecified: Secondary | ICD-10-CM

## 2014-08-18 NOTE — Telephone Encounter (Signed)
Pt is needing to talk with dr hopper about being referred to [facility] neurology [phone]  Fax number to [facility] is [phone]

## 2014-08-18 NOTE — Telephone Encounter (Signed)
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 Please advise. Thanks.

## 2014-08-19 NOTE — Telephone Encounter (Signed)
Okay to make the referral.  They will evaluate the neuropathy, but may not do anything about the tendonopathy. Would she like a referral to another rheumatologist there also?

## 2014-08-20 NOTE — Telephone Encounter (Signed)
Referrals created per Dr. Linna Darner

## 2014-08-31 ENCOUNTER — Ambulatory Visit (INDEPENDENT_AMBULATORY_CARE_PROVIDER_SITE_OTHER): Payer: Self-pay | Admitting: Nurse Practitioner

## 2014-08-31 ENCOUNTER — Encounter: Payer: Self-pay | Admitting: Nurse Practitioner

## 2014-08-31 VITALS — BP 110/66 | HR 76 | Temp 98.1°F | Ht 65.5 in | Wt 185.0 lb

## 2014-08-31 DIAGNOSIS — R300 Dysuria: Secondary | ICD-10-CM

## 2014-08-31 LAB — POCT URINALYSIS DIPSTICK
Bilirubin, UA: NEGATIVE
Glucose, UA: NEGATIVE
NITRITE UA: NEGATIVE
Protein, UA: NEGATIVE
UROBILINOGEN UA: NEGATIVE
pH, UA: 7

## 2014-08-31 MED ORDER — FLUCONAZOLE 150 MG PO TABS: 150.0000 mg | ORAL_TABLET | Freq: Once | ORAL | Status: DC

## 2014-08-31 MED ORDER — SULFAMETHOXAZOLE-TMP DS 800-160 MG PO TABS: 1.0000 | ORAL_TABLET | Freq: Two times a day (BID) | ORAL | Status: DC

## 2014-08-31 NOTE — Progress Notes (Signed)
S:  58 y.o.Married Caucasian female presents with complaint of UTI. Symptoms began 1 week ago. With symptoms of burning with urination, urinary frequency, pelvic pressure. Pertinent negatives include:  patient is having no constitutional symptoms, denying fever, chills, anorexia, or weight loss.   Sexually active yes.  Symptoms are not related to post coital.    Current method of birth control Sterilization by Laparoscopy and  Menopausal.  Some vaginal dryness. Same partner without change.   Last UTI documented: 1 year ago 03/2013  ROS: no weight loss, fever, night sweats, feels well and fatigued  O alert, oriented to person, place, and time   healthy,  alert and  not in acute distress  mild  No CVA tenderness  deferred   Diagnostic Test:    Urinalysis WBC- 1 +, RBC- trace   urine culture  Assessment:  Medications: TMP/SMX. Maintain adequate hydration. Follow up if symptoms not improving, and as needed.    Plan:   Medication Therapy: Septra DS BID for a week  Lab:TOC if Urine Culture is positive

## 2014-08-31 NOTE — Patient Instructions (Signed)

## 2014-09-03 ENCOUNTER — Telehealth: Payer: Self-pay

## 2014-09-03 LAB — URINE CULTURE: Colony Count: >=100000

## 2014-09-03 NOTE — Telephone Encounter (Signed)
Pt informed of results. She voiced understanding.  Nurse appt made on 09/17/14.

## 2014-09-03 NOTE — Telephone Encounter (Signed)
Message copied by Gerda Diss on Fri Sep 03, 2014  2:49 PM ------      Message from: Kem Boroughs R      Created: Fri Sep 03, 2014  1:08 PM       Please let patient know that she is on the correct med's and needs a TOC in 2 weeks. ------

## 2014-09-05 NOTE — Progress Notes (Signed)
Encounter reviewed by Dr. Brook Silva.  

## 2014-09-17 ENCOUNTER — Ambulatory Visit (INDEPENDENT_AMBULATORY_CARE_PROVIDER_SITE_OTHER): Payer: Self-pay | Admitting: *Deleted

## 2014-09-17 VITALS — BP 100/64 | HR 88 | Resp 16 | Ht 65.5 in | Wt 189.0 lb

## 2014-09-17 DIAGNOSIS — R300 Dysuria: Secondary | ICD-10-CM

## 2014-09-17 NOTE — Progress Notes (Signed)
Patient in today for TOC. Patient states she is done with Ab. She denies any Sx. Advised pt to call if sx come back. Pt agreed.

## 2014-09-18 LAB — URINE CULTURE

## 2014-09-27 ENCOUNTER — Encounter: Payer: Self-pay | Admitting: Nurse Practitioner

## 2015-01-27 ENCOUNTER — Telehealth: Payer: Self-pay | Admitting: Obstetrics & Gynecology

## 2015-01-27 NOTE — Telephone Encounter (Signed)
Spoke with patient. Patient states "I am at work so I can not give much details because I have limited privacy." Patient is experiencing pelvic/bladder pressure that she states is "ongoing." "It has caused me some UTI problems in the past." Denies burning with urination, urgency, frequency, fever, chills, or lower back pain. "It is just a constant pressure that has been going on for a while." Advised patient will need to be seen for evaluation. Patient is agreeable. Requests to see Dr.Miller only. Appointment scheduled for 3/7 at 9:15am with Dr.Miller. Patient is agreeable to date and time. Advised patient if symptoms worsen will need to be seen sooner or at local urgent care. Patient is agreeable.  Routing to provider for final review. Patient agreeable to disposition. Will close encounter

## 2015-01-27 NOTE — Telephone Encounter (Signed)
Patient is having pressure in her lower area and would like an appointment.

## 2015-01-31 ENCOUNTER — Telehealth: Payer: Self-pay | Admitting: Obstetrics & Gynecology

## 2015-01-31 ENCOUNTER — Encounter: Payer: Self-pay | Admitting: Obstetrics & Gynecology

## 2015-01-31 ENCOUNTER — Ambulatory Visit (INDEPENDENT_AMBULATORY_CARE_PROVIDER_SITE_OTHER): Payer: 59 | Admitting: Obstetrics & Gynecology

## 2015-01-31 VITALS — BP 110/64 | HR 64 | Temp 98.0°F | Resp 12 | Wt 196.4 lb

## 2015-01-31 DIAGNOSIS — M79629 Pain in unspecified upper arm: Secondary | ICD-10-CM | POA: Diagnosis not present

## 2015-01-31 DIAGNOSIS — M25439 Effusion, unspecified wrist: Secondary | ICD-10-CM

## 2015-01-31 DIAGNOSIS — R29898 Other symptoms and signs involving the musculoskeletal system: Secondary | ICD-10-CM | POA: Diagnosis not present

## 2015-01-31 DIAGNOSIS — M25529 Pain in unspecified elbow: Secondary | ICD-10-CM

## 2015-01-31 DIAGNOSIS — R768 Other specified abnormal immunological findings in serum: Secondary | ICD-10-CM | POA: Diagnosis not present

## 2015-01-31 DIAGNOSIS — R700 Elevated erythrocyte sedimentation rate: Secondary | ICD-10-CM | POA: Diagnosis not present

## 2015-01-31 NOTE — Progress Notes (Signed)
Subjective:     Patient ID: Jill Lara, female   DOB: Nov 16, 1956, 59 y.o.   MRN: [id number]  HPI  58 yo G2P1 MWF here for discussion of urinary symptoms.  Pt reports about a year ago, in January, pt diagnosed with pneumonia.  Had a reaction to the antibiotic (Leovquin) that ultimately caused a bilateral biceps tendonopathy (or at least that is the diagnosis).  Pt was referred to PT from then until July.  Pt reports she had so much trouble with her arms that she had a lot of trouble cleaning herself. She can't cook or clean her house.  Her husband is taking over all of the house responsibilities.  She feels she is having some issues with staying clean personally due to weakness in arms/hands.  She reports she's had two UTIs.  She came here for one of them and urine culture showed E coli.  She is wearing a pad and she is feeling damp most of the time.    Pt reports she has seen a neurologist at [facility]--Dr. Larkin Ina Mhoon.  Seen 11/09/14.  Nerve conduction studies done 12/21/14.  ANA was +.  ESR was 18, down from 96 in 3/15 when all of this started.  Has not a rheumatologist.    Notes reviewed in Care Everywhere including notes and EMG.  EMG results from [facility]: "NCS -The bilateral ulnar mixed, median mixed, median sensory and ulnar sensory responses were normal apart from artifactually reduced mixed amplitudes due to palm thickness. The bilateral median motor responses were normal with normal F-waves. The bilateral ulnar motor responses was normal with normal F-waves. 2. EMG - Concentric needle examination of the bilateral upper extremities was performed. All muscles tested were normal.  CONCLUSION: This is a normal study. There is no  electrophysiologic evidence of a right or left upper extremity  neuropathy, plexopathy, or myopathy. "  Saw Dr. Joni Fears, here, in 2/15 due to the pain.  X ray was negative and he didn't know what else to do and was given an arm sling to alternative.  At that time, pt  already on NSAIDS, which never really helped.  She didn't return to him as she felt he really had not idea what was causing the issue.    Pt has done some of her own research and would consider seeing Dr. Amedeo Plenty.     Pt states she doesn't want pain medication, disability, or anything other than to try and get her life back.  Review of Systems  Constitutional: Positive for activity change (due to issues with arms) and unexpected weight change (weight gain due to decreased activity). Negative for fever, chills and fatigue.  Genitourinary: Negative for dysuria, urgency, frequency, vaginal bleeding and vaginal discharge.  Musculoskeletal: Positive for joint swelling (wrists) and arthralgias.       Objective:   Physical Exam  Constitutional: She is oriented to person, place, and time. She appears well-developed and well-nourished.  Abdominal: Soft. Bowel sounds are normal.  Genitourinary: Vagina normal and uterus normal. There is no rash, tenderness, lesion or injury on the right labia. There is no rash, tenderness, lesion or injury on the left labia. Cervix exhibits no motion tenderness. Right adnexum displays no mass and no tenderness. Left adnexum displays no mass and no tenderness. Bleeding: atrophic changes noted.  Decrease rectal tone.  Poor Kegel maneuver.  Moderate vaginal tone.  Lymphadenopathy:       Right: No inguinal adenopathy present.       Left:  No inguinal adenopathy present.  Neurological: She is alert and oriented to person, place, and time.  Skin: Skin is warm and dry.  Psychiatric: She has a normal mood and affect.       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  Now with wrist swelling, pain and weakness in upper extremities + ANA, elevated ESR     Plan:     Refer to Rheumatologist Refer to Dr. Alver Sorrow amount of topical estrogen near urethra three to four times  weekly.  Do not think vaginal estrogen will help Consider Pelvic PT in future after above referral are done     ~30 minutes spent with patient >50% of time was in face to face discussion of above.

## 2015-01-31 NOTE — Telephone Encounter (Signed)
Patient is asking to talk to Dr.Miller's nurse regarding her referral. Last seen today.

## 2015-01-31 NOTE — Telephone Encounter (Signed)
Spoke with patient. Patient states "When I was leaving today Dr.Miller said she was going to place two referrals for me and I am not sure if I need to do anything for the appointments." Advised patient both referrals have been placed to orthopedics and rheumatology. Advised patient our referrals coordinator in the office will work with their offices to provide all needed information and set up appointment. Advised will hear from our referrals coordinator or their office directly in regards to scheduling appointment. Patient is agreeable.  Cc: Jill Lara  Routing to provider for final review. Patient agreeable to disposition. Will close encounter

## 2015-01-31 NOTE — Telephone Encounter (Signed)
Left message to call Kaitlyn at [phone]. 

## 2015-02-03 NOTE — Telephone Encounter (Signed)
Spoke with both providers offices that the patient is being referred.  Dr Arlean Hopping office is reviewing patient info and will contact her to schedule. Dr Vanetta Shawl office has already spoken with patient and they are waiting for her to send in requested information/records from a previous providers office.

## 2015-02-04 ENCOUNTER — Telehealth: Payer: Self-pay | Admitting: Obstetrics & Gynecology

## 2015-02-04 NOTE — Telephone Encounter (Signed)
Call to patient to provide an update regarding 2 referrals from earlier this week. Advised patient that Dr Arlean Hopping office is reviewing her patient info and will contact her to schedule. Confirmed that Dr Vanetta Shawl office has already spoken with patient and they are waiting for her to send in requested information/records from a previous providers office. Patient states that she will be sending the necessary documentation to Dr Carmelia Bake office.

## 2015-02-04 NOTE — Telephone Encounter (Signed)
Thank you.  OK to close encounter. 

## 2015-04-04 ENCOUNTER — Telehealth: Payer: Self-pay | Admitting: Obstetrics & Gynecology

## 2015-04-04 NOTE — Telephone Encounter (Signed)
Per office staff at Dr Anette Guarneri office, they have made multiple attempts to contact the patient. They left voicemail messages on 03.18 and 04.30. The patient has not responded

## 2015-05-19 ENCOUNTER — Telehealth: Payer: Self-pay | Admitting: Obstetrics & Gynecology

## 2015-05-19 NOTE — Telephone Encounter (Signed)
Dr Sabra Heck,         I am attempting to follow up on some previously existing referrals and this is the outcome of Jill Lara.        Referral initiated in march 2016. Followed up with dr Vanetta Shawl office on outcome of appointment and received information that patient was never scheduled. Contacted patient. Patient states she saw her husbands primary care doctor in [facility] and was referred to Rheumatology. Pt saw Dr Dellia Nims who diagnosed patient with Rheumatoid Arthritis. Patient states she decided to delay orthopedic appointment until her RA was under control. "They are trying to stop further damage before i see Dr Amedeo Plenty about the damage that's there."        Advised patient i would close her orthopedic referral for now and she is welcome to call and request new referral when she is ready to see Dr Amedeo Plenty. Pt agreeable and appreciative.         Thank you,        Becky     Note from referral coordination placed in EPIC to see in future if needed.

## 2017-06-20 NOTE — Progress Notes (Signed)
61 y.o. G3P0011 Married Caucasian F here for annual exam.  Reports she is doing much better with her wrists.  Was diagnosed with rheumatoid arthritis.  Seeing rheumatologist in [place].  She would like to see someone locally due to the drive.  Has been on Methotrexate and Humira.    Wants to have a PCP that is locally.  Has panel of blood work scheduled in the next month.  Doesn't need today.    [name] last menstrual period was 09/26/2009.          Sexually active: Yes.    The current method of family planning is post menopausal status.    Exercising: No.  The patient does not participate in regular exercise at present. Smoker:  no  Health Maintenance: Pap:  Unsure  History of abnormal Pap:  no MMG:  12/03/12 BIRADS 1 negative  Colonoscopy:  never BMD:   never TDaP:  2013  Pneumonia vaccine(s):  never Shingles vaccination. Hep C testing: discuss with provider Screening Labs: discuss with provider, Hb today: same, Urine today: normal    reports that she has never smoked. She has never used smokeless tobacco. She reports that she drinks about 1.2 oz of alcohol per week . She reports that she does not use drugs.  Past Medical History:  Diagnosis Date  . 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 Gemma Payor in [place]    Past Surgical History:  Procedure Laterality Date  . CERVICAL POLYPECTOMY  2003  . tonselectomy     at age 89  . TUBAL LIGATION     BTL    Current Outpatient Prescriptions  Medication Sig Dispense Refill  . HUMIRA PEN 40 MG/0.8ML PNKT     . methotrexate (RHEUMATREX) 2.5 MG tablet   0   No current facility-administered medications for this visit.     Family History  Problem Relation Age of Onset  . Heart disease Mother   . Hyperlipidemia Brother   . Heart disease Maternal Grandmother   . Cancer - Lung Maternal Grandfather   . Brain cancer Paternal Grandmother     ROS:  Pertinent items are noted in HPI.  Otherwise, a comprehensive ROS  was negative.  Exam:   BP 122/88 (BP Location: Right Arm, Patient Position: Sitting, Cuff Size: Normal)   Pulse 78   Resp 14   Ht 5' 5.25" (1.657 m)   Wt 196 lb 6.4 oz (89.1 kg)   LMP 09/26/2009   BMI 32.43 kg/m    Height: 5' 5.25" (165.7 cm)  Ht Readings from Last 3 Encounters:  06/21/17 5' 5.25" (1.657 m)  09/17/14 5' 5.5" (1.664 m)  08/31/14 5' 5.5" (1.664 m)    General appearance: alert, cooperative and appears stated age Head: Normocephalic, without obvious abnormality, atraumatic Neck: no adenopathy, supple, symmetrical, trachea midline and thyroid normal to inspection and palpation Lungs: clear to auscultation bilaterally Breasts: normal appearance, no masses or tenderness Heart: regular rate and rhythm Abdomen: soft, non-tender; bowel sounds normal; no masses,  no organomegaly Extremities: extremities normal, atraumatic, no cyanosis or edema Skin: Skin color, texture, turgor normal. No rashes or lesions Lymph nodes: Cervical, supraclavicular, and axillary nodes normal. No abnormal inguinal nodes palpated Neurologic: Grossly normal   Pelvic: External genitalia:  no lesions              Urethra:  normal appearing urethra with no masses, tenderness or lesions  Bartholins and Skenes: normal                 Vagina: normal appearing vagina with normal color and discharge, erythematous nodular lesion on left vaginal sidewall noted.  Biopsy recommended.  Verbal consent obtained.  Area cleansed by Betadine x 3.  Biopsy forceps used to fully remove lesion with biopsy.  Monsel's applied.  Excellent hemostasis noted.  Pt has not pain and tolerated this well.              Cervix: no lesions              Pap taken: Yes.   Bimanual Exam:  Uterus:  normal size, contour, position, consistency, mobility, non-tender              Adnexa: normal adnexa and no mass, fullness, tenderness               Rectovaginal: Confirms               Anus:  normal sphincter tone, no  lesions  Chaperone was present for exam.  A:  Well Woman with normal exam PMP, no HRT RA Erythematous vaginal lesion noted, biopsy obtained today Fecal soilage   P:   Mammogram needs to be updated.  Pt states she will schedule. Order for BMD placed.  She will do this with MMG. Referral to rheumatology placed today.  Internal medicine names given. D/W colonoscopy.  Would refer to Dr. Carlean Purl when pt ready.  D/w cologuard today.  She will let me know her decision Will do blood work with PCP.  D/w Hep c testing. Shingles vaccination discussed as well. pap smear and HR HPV obtained today Vaginal biopsy will be called to pt as well Fiber for bulking recommended.  Referal to PT discussed.  Advised pt to used fiber for 4 weeks and then give update. return annually or prn

## 2017-06-21 ENCOUNTER — Encounter: Payer: Self-pay | Admitting: Obstetrics & Gynecology

## 2017-06-21 ENCOUNTER — Other Ambulatory Visit (HOSPITAL_COMMUNITY)
Admission: RE | Admit: 2017-06-21 | Discharge: 2017-06-21 | Disposition: A | Discharge status: Home / Self Care | Payer: 59 | Source: Ambulatory Visit | Attending: Obstetrics & Gynecology | Admitting: Obstetrics & Gynecology

## 2017-06-21 ENCOUNTER — Ambulatory Visit (INDEPENDENT_AMBULATORY_CARE_PROVIDER_SITE_OTHER): Payer: 59 | Admitting: Obstetrics & Gynecology

## 2017-06-21 VITALS — BP 122/88 | HR 78 | Resp 14 | Ht 65.25 in | Wt 196.4 lb

## 2017-06-21 DIAGNOSIS — N898 Other specified noninflammatory disorders of vagina: Secondary | ICD-10-CM | POA: Diagnosis not present

## 2017-06-21 DIAGNOSIS — M05732 Rheumatoid arthritis with rheumatoid factor of left wrist without organ or systems involvement: Secondary | ICD-10-CM | POA: Diagnosis not present

## 2017-06-21 DIAGNOSIS — E2839 Other primary ovarian failure: Secondary | ICD-10-CM

## 2017-06-21 DIAGNOSIS — Z0000 Encounter for general adult medical examination without abnormal findings: Secondary | ICD-10-CM

## 2017-06-21 DIAGNOSIS — Z124 Encounter for screening for malignant neoplasm of cervix: Secondary | ICD-10-CM

## 2017-06-21 DIAGNOSIS — M069 Rheumatoid arthritis, unspecified: Secondary | ICD-10-CM | POA: Insufficient documentation

## 2017-06-21 DIAGNOSIS — Z01419 Encounter for gynecological examination (general) (routine) without abnormal findings: Secondary | ICD-10-CM

## 2017-06-21 DIAGNOSIS — M05731 Rheumatoid arthritis with rheumatoid factor of right wrist without organ or systems involvement: Secondary | ICD-10-CM

## 2017-06-21 LAB — POCT URINALYSIS DIPSTICK
BILIRUBIN UA: NEGATIVE
Blood, UA: NEGATIVE
Glucose, UA: NEGATIVE
Ketones, UA: NEGATIVE
Leukocytes, UA: NEGATIVE
Nitrite, UA: NEGATIVE
Protein, UA: NEGATIVE
Urobilinogen, UA: 0.2 E.U./dL
pH, UA: 5 (ref 5.0–8.0)

## 2017-06-21 NOTE — Patient Instructions (Addendum)
Dr. Leigh Aurora Address: [address], [place], [place] [address]  Phone: [phone]  [facility] Address: [address], [place], [place] [address]  Phone: [phone]  Dennie Maizes, [place] bone density testing with your next memmogram.  I placed the order for this already.  So when you call to schedule, just tell them to need both and I have already put in an order.  Make sure you have a hep C test done with your next routine lab work.

## 2017-06-25 ENCOUNTER — Telehealth: Payer: Self-pay | Admitting: *Deleted

## 2017-06-25 LAB — CYTOLOGY - PAP
DIAGNOSIS: NEGATIVE
HPV (WINDOPATH): NOT DETECTED

## 2017-06-25 NOTE — Telephone Encounter (Signed)
Message left to return call to Kammy Klett at [phone].    

## 2017-06-25 NOTE — Telephone Encounter (Signed)
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  Nothing else needs to be done for this unless she is having issues with the biopsy site.

## 2017-06-28 NOTE — Telephone Encounter (Signed)
Patient returning your call.

## 2017-06-28 NOTE — Telephone Encounter (Signed)
Returned call to patient. Results given to patient as seen below from Dr. Sabra Heck and she verbalized understanding. Patient states biopsy site is healing well.   Patient agreeable to disposition. Will close encounter.

## 2017-07-05 ENCOUNTER — Other Ambulatory Visit: Payer: Self-pay | Admitting: Obstetrics & Gynecology

## 2017-07-05 DIAGNOSIS — Z1231 Encounter for screening mammogram for malignant neoplasm of breast: Secondary | ICD-10-CM

## 2017-07-08 ENCOUNTER — Telehealth: Payer: Self-pay | Admitting: Obstetrics & Gynecology

## 2017-07-08 NOTE — Telephone Encounter (Signed)
Spoke with patient. She received a voicemail from their practice and verbalized their contact number to me. She will call for an appointment. I will defer referral to follow up.  No further questions. Routing to provider for review. Will close encounter.

## 2017-07-08 NOTE — Telephone Encounter (Signed)
Call to patient to review referral contact information. We are attempting to refer her to [facility]umat[facility]. According to their scheduler, they are ready to schedule her appointment. She may call their office to schedule @ [phone].

## 2017-07-18 ENCOUNTER — Ambulatory Visit
Admission: RE | Admit: 2017-07-18 | Discharge: 2017-07-18 | Disposition: A | Discharge status: Home / Self Care | Payer: 59 | Source: Ambulatory Visit | Attending: Obstetrics & Gynecology | Admitting: Obstetrics & Gynecology

## 2017-07-18 DIAGNOSIS — Z1231 Encounter for screening mammogram for malignant neoplasm of breast: Secondary | ICD-10-CM

## 2017-07-18 DIAGNOSIS — E2839 Other primary ovarian failure: Secondary | ICD-10-CM

## 2017-07-22 ENCOUNTER — Telehealth: Payer: Self-pay | Admitting: *Deleted

## 2017-07-22 NOTE — Telephone Encounter (Signed)
Left message to call regarding results -eh 

## 2017-07-22 NOTE — Telephone Encounter (Signed)
-----   Message from Megan Salon, MD sent at 07/19/2017  1:59 PM EDT ----- Please let pt know she has osteoporosis in her spine.  Hips have some osteopenia that is not worrisome.  I think she needs a consult and some lab work as well as discussion about possible treatment.  Thanks.

## 2017-07-22 NOTE — Telephone Encounter (Signed)
Spoke with patient and gave results and scheduled for consult with Dr. Sabra Heck

## 2017-07-30 ENCOUNTER — Encounter: Payer: Self-pay | Admitting: Obstetrics & Gynecology

## 2017-07-30 ENCOUNTER — Ambulatory Visit (INDEPENDENT_AMBULATORY_CARE_PROVIDER_SITE_OTHER): Payer: 59 | Admitting: Obstetrics & Gynecology

## 2017-07-30 VITALS — BP 126/84 | HR 78 | Resp 16 | Ht 65.25 in | Wt 197.0 lb

## 2017-07-30 DIAGNOSIS — M810 Age-related osteoporosis without current pathological fracture: Secondary | ICD-10-CM

## 2017-07-30 DIAGNOSIS — M85851 Other specified disorders of bone density and structure, right thigh: Secondary | ICD-10-CM

## 2017-07-30 DIAGNOSIS — E559 Vitamin D deficiency, unspecified: Secondary | ICD-10-CM

## 2017-07-30 DIAGNOSIS — M85852 Other specified disorders of bone density and structure, left thigh: Secondary | ICD-10-CM | POA: Diagnosis not present

## 2017-07-30 HISTORY — DX: Other specified disorders of bone density and structure, right thigh: M85.851

## 2017-07-30 NOTE — Progress Notes (Signed)
Subjective:    16 yrs Married Caucasian G2P0011  female here to discuss recent BMD obtained 07/18/17 showing showing osteoporosis in lumbar spine with t score -3.0.  This was first BMD pt has even had done.     Osteoporosis Risk Factors  Nonmodifiable Personal Hx of fracture as an adult: yes - due to crush injury Hx of fracture in first-degree relative: no Caucasian race: yes Advanced age: no Female sex: yes Dementia: no Poor health/frailty: no  Potentially modifiable: Tobacco use: no Low body weight (<127 lbs): no Estrogen deficiency  early menopause (age <45) or bilateral ovariectomy: no  prolonged premenopausal amenorrhea (>1 yr): no Low calcium intake (lifelong): no Alcohol use more than 2 drinks per day: no Recurrent falls: no Inadequate physical activity: yes  Current calcium and Vit D intake:  None and gets very little calcium in diet.  "Doesn't drink much mik".  Review of Systems A comprehensive review of systems was negative.     Objective:   PHYSICAL EXAM BP 126/84 (BP Location: Right Arm, Patient Position: Sitting, Cuff Size: Large)   Pulse 78   Resp 16   Ht 5' 5.25" (1.657 m)   Wt 197 lb (89.4 kg)   LMP 09/26/2009   BMI 32.53 kg/m  General appearance: alert, cooperative and no distress  Imaging Bone Density: Spine T Score: -3.0, Hip T Score: -1.9   Done on 07/18/17 FRAX score:  Not calculated  Discussion: Findings reviewed and scoring discussed.  Evaluation for secondary causes discussed as well.  Indications for medications discussed--which pt meets.  She is really not interested in medication at this time.  Desires to try and improve diet, calcium intake and exercise and see if this has any bearing on repeat BMD in the future.  Fracture risk discussed.  Fall prevention discussed.                            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   PTH, CMP, CBC, Phosphorus, Vit D, and TSH obtained today.  2.  1200mg  total calcium intake discussed.  Pt will need to start supplements.  Vit D of [phone] IU daily recommended.  3.  Exercise recommended at least 30 minutes 3 times per week.   4.  Fall prevention discussed.  5.  Repeat BMD 2 years.  ~25 minutes spent with patient >50% of time was in face to face discussion of above.

## 2017-07-31 LAB — COMPREHENSIVE METABOLIC PANEL
A/G RATIO: 2 (ref 1.2–2.2)
ALBUMIN: 4.7 g/dL (ref 3.6–4.8)
ALK PHOS: 63 IU/L (ref 39–117)
ALT: 17 IU/L (ref 0–32)
AST: 23 IU/L (ref 0–40)
BUN / CREAT RATIO: 22 (ref 12–28)
BUN: 16 mg/dL (ref 8–27)
Bilirubin Total: 0.4 mg/dL (ref 0.0–1.2)
CO2: 23 mmol/L (ref 20–29)
CREATININE: 0.72 mg/dL (ref 0.57–1.00)
Calcium: 9.6 mg/dL (ref 8.7–10.3)
Chloride: 104 mmol/L (ref 96–106)
GFR calc Af Amer: 104 mL/min/{1.73_m2} (ref >59)
GFR, EST NON AFRICAN AMERICAN: 90 mL/min/{1.73_m2} (ref >59)
GLOBULIN, TOTAL: 2.3 g/dL (ref 1.5–4.5)
Glucose: 85 mg/dL (ref 65–99)
Potassium: 4.4 mmol/L (ref 3.5–5.2)
Sodium: 143 mmol/L (ref 134–144)
Total Protein: 7 g/dL (ref 6.0–8.5)

## 2017-07-31 LAB — CBC
HEMATOCRIT: 39.9 % (ref 34.0–46.6)
HEMOGLOBIN: 13 g/dL (ref 11.1–15.9)
MCH: 29.5 pg (ref 26.6–33.0)
MCHC: 32.6 g/dL (ref 31.5–35.7)
MCV: 91 fL (ref 79–97)
Platelets: 287 10*3/uL (ref 150–379)
RBC: 4.41 x10E6/uL (ref 3.77–5.28)
RDW: 13.8 % (ref 12.3–15.4)
WBC: 6.8 10*3/uL (ref 3.4–10.8)

## 2017-07-31 LAB — PHOSPHORUS: Phosphorus: 3.8 mg/dL (ref 2.5–4.5)

## 2017-07-31 LAB — PARATHYROID HORMONE, INTACT (NO CA): PTH: 32 pg/mL (ref 15–65)

## 2017-07-31 LAB — VITAMIN D 25 HYDROXY (VIT D DEFICIENCY, FRACTURES): VIT D 25 HYDROXY: 21.8 ng/mL — AB (ref 30.0–100.0)

## 2017-07-31 LAB — TSH: TSH: 2.03 u[IU]/mL (ref 0.450–4.500)

## 2017-08-07 NOTE — Addendum Note (Signed)
Addended by: Megan Salon on: 08/07/2017 01:03 PM   Modules accepted: Orders

## 2017-08-08 ENCOUNTER — Telehealth: Payer: Self-pay

## 2017-08-08 MED ORDER — VITAMIN D (ERGOCALCIFEROL) 1.25 MG (50000 UNIT) PO CAPS: 50000.0000 [IU] | ORAL_CAPSULE | ORAL | 0 refills | Status: DC

## 2017-08-08 NOTE — Telephone Encounter (Signed)
Called patient at [phone] and per DPR, left detailed message stating labs all normal except vitamin D is low and need to take Rx Vitamin D. Will send Rx Vitamin D 50,000 U #12, 1 po weekly x12 weeks, NR to pharmacy. Advised patient needs to call and make follow up lab appointment in 12 weeks to recheck vitamin D level and to call if any questions.

## 2017-08-08 NOTE — Telephone Encounter (Signed)
-----   Message from Megan Salon, MD sent at 08/07/2017  1:02 PM EDT ----- Please let pt know her lab work was all normal except her Vit D was low.  She needs to take 50k weekly x 12 weeks and repeat level.  Order placed for repeat Vit D.

## 2017-08-13 ENCOUNTER — Telehealth: Payer: Self-pay | Admitting: Obstetrics & Gynecology

## 2017-08-13 NOTE — Telephone Encounter (Signed)
Spoke with patient, calling to clarify recommended daily  Calcium and Vitamin D?  1. Per Ov notes dated 07/30/17, Calcium 1200mg  total daily.   2. Rx  Vit D 50,000 IU weekly until lab repeated in 3 months, Dr. Sabra Heck will then advise at that time recommended daily dosing.   3. Patient asking if ok to take calcium supplement with added Vitamin D now in addition to RX? Advised not to take additional Vitamin D, only prescription.   Advised patient Dr. Sabra Heck will review, will return call with any additional recommendations, patient verbalizes understanding and is agreeable.   Routing to provider for final review. Patient is agreeable to disposition. Will close encounter.

## 2017-08-13 NOTE — Telephone Encounter (Signed)
Left message to call Delmo Matty at [phone].  

## 2017-08-13 NOTE — Telephone Encounter (Signed)
Patient calling with questions about her vitamin D directions.

## 2017-08-14 NOTE — Telephone Encounter (Signed)
Spoke with patient, advised as seen below per Dr. Sabra Heck. Patient verbalizes understanding and is agreeable.  Patient is agreeable to disposition. Will close encounter.

## 2017-08-14 NOTE — Telephone Encounter (Signed)
Left message to call Beverlee Wilmarth at [phone].  

## 2017-08-14 NOTE — Telephone Encounter (Signed)
It's pretty hard to find calcium without Vit D added so it is ok to take the Vit D with the calcium in addition to the supplement.  That is why I am rechecking the value in a few months.  It does take a while usually for this to improve.  Thanks.

## 2018-02-19 ENCOUNTER — Encounter: Payer: Self-pay | Admitting: Certified Nurse Midwife

## 2018-02-19 ENCOUNTER — Other Ambulatory Visit: Payer: Self-pay

## 2018-02-19 ENCOUNTER — Ambulatory Visit: Payer: 59 | Admitting: Certified Nurse Midwife

## 2018-02-19 VITALS — BP 116/76 | HR 68 | Temp 98.3°F | Resp 16 | Ht 65.25 in | Wt 195.0 lb

## 2018-02-19 DIAGNOSIS — N390 Urinary tract infection, site not specified: Secondary | ICD-10-CM | POA: Diagnosis not present

## 2018-02-19 DIAGNOSIS — N951 Menopausal and female climacteric states: Secondary | ICD-10-CM

## 2018-02-19 DIAGNOSIS — R319 Hematuria, unspecified: Secondary | ICD-10-CM

## 2018-02-19 LAB — POCT URINALYSIS DIPSTICK
Bilirubin, UA: NEGATIVE
Glucose, UA: NEGATIVE
KETONES UA: NEGATIVE
NITRITE UA: POSITIVE
PH UA: 5 (ref 5.0–8.0)
UROBILINOGEN UA: NEGATIVE U/dL — AB

## 2018-02-19 MED ORDER — SULFAMETHOXAZOLE-TRIMETHOPRIM 800-160 MG PO TABS: ORAL_TABLET | ORAL | 0 refills | Status: DC

## 2018-02-19 NOTE — Patient Instructions (Signed)

## 2018-02-19 NOTE — Progress Notes (Signed)
62 y.o. Married Caucasian female G2P0011 here with complaint of UTI, with onset  on 02/16/18. Patient complaining of urinary frequency/urgency/ and pain with urination. Patient denies fever, chills. Some nausea, no vomiting with slight back pain. No new personal products. Patient feels feels related to sexual activity. Denies any vaginal symptoms.  . Menopausal with some vaginal dryness does not use moisturizer. Patient is consuming adequate water intake,and some juice. Just has not felt good today. Will be going to work shortly. Has several medication allergies, but can take Bactrim DS. No other health issues today.  ROS Pertinent to HPI  O: Healthy female WDWN Affect: Normal, orientation x 3 Skin : warm and dry CVAT: negative bilateral Abdomen: positive for suprapubic tenderness  Pelvic exam: External genital area: normal, no lesions, atrophic appearance Bladder,Urethra tender, Urethral meatus: tender, red Vagina: normal vaginal discharge, atrophic appearance, non tender, scant moisture Cervix: normal, non tender Uterus:normal,non tender Adnexa: normal non tender, no fullness or masses   A: UTI, post coital? Normal pelvic exam with vaginal dryness noted poct urine-2+, nitrite +, protein 2+, wbc 2+  P: Reviewed findings of UTI and need for treatment. Discussed can be at increase risk of UTI with vaginal dryness and after sexual activity. Discussed moisturizer use for vaginal dryness. Patient will consider. Discussed need to empty bladder after sexual activity to prevent UTI's. Questions addressed. Discussed medication use with her Methotrexate. She will not be taking it again for 4 days, so should not be a concern with antibiotic use. Rx: Bactrim DS see order with instructions MBE:[id number] micro, culture Reviewed warning signs and symptoms of UTI and need to advise if occurring. Encouraged to limit soda, tea, and coffee and be sure to increase water intake.   RV prn

## 2018-02-20 LAB — URINALYSIS, MICROSCOPIC ONLY
CASTS: NONE SEEN /LPF
WBC, UA: >30 /hpf — AB (ref 0–5)

## 2018-02-21 ENCOUNTER — Other Ambulatory Visit: Payer: Self-pay

## 2018-02-21 ENCOUNTER — Telehealth: Payer: Self-pay

## 2018-02-21 ENCOUNTER — Telehealth: Payer: Self-pay | Admitting: Obstetrics & Gynecology

## 2018-02-21 DIAGNOSIS — N390 Urinary tract infection, site not specified: Secondary | ICD-10-CM

## 2018-02-21 DIAGNOSIS — R319 Hematuria, unspecified: Principal | ICD-10-CM

## 2018-02-21 LAB — URINE CULTURE

## 2018-02-21 MED ORDER — SULFAMETHOXAZOLE-TRIMETHOPRIM 800-160 MG PO TABS: ORAL_TABLET | ORAL | 0 refills | Status: DC

## 2018-02-21 NOTE — Telephone Encounter (Signed)
Patient is asking to talk with Joy again. Patient states that she was talking to her earlier again and has another question. No open telephone note.

## 2018-02-21 NOTE — Telephone Encounter (Signed)
Joy returned call to patient. See note below.  Notes recorded by Susy Manor, CMA on 02/21/2018 at 4:41 PM EDT Pt notified of final urine culture results. Due to patient allergies rx called in for 3 more days of Bactrim per Wonda Amis. Pt aware to pick it up & continue with it. Pt to discontinue methotrexate & not take it until a couple days after antibiotic is finished. Pt agrees. Pt aware to be seen at urgent care or the ER if symptoms increase. ------  Notes recorded by Susy Manor, CMA on 02/21/2018 at 2:02 PM EDT Patient notified of results as written by provider. Final report for culture is back. Pt states after she left here she felt really bad with chills & a fever of 100. Pt states fever & chills have gone away but is having worse back pain. Please advise. ------  Notes recorded by Susy Manor, CMA on 02/21/2018 at 10:29 AM EDT Left message for patient to callback ------  Notes recorded by Regina Eck, CNM on 02/21/2018 at 8:12 AM EDT Notify patient that urine micro showed bacteria and WBC and RBC's. Preliminary culture showing E.Coli so she is on correct medication at this point. Patient status?

## 2018-02-21 NOTE — Telephone Encounter (Signed)
Patient notified of results as written by provider 

## 2018-02-21 NOTE — Telephone Encounter (Signed)
[email or web address]

## 2018-02-21 NOTE — Telephone Encounter (Signed)
Patient returned call

## 2018-02-21 NOTE — Telephone Encounter (Signed)
-----   Message from Regina Eck, CNM sent at 02/21/2018  8:12 AM EDT ----- Notify patient that urine micro showed bacteria and WBC and RBC's. Preliminary culture showing E.Coli so she is on correct medication at this point. Patient status?

## 2018-05-14 ENCOUNTER — Telehealth: Payer: Self-pay | Admitting: Obstetrics & Gynecology

## 2018-05-14 ENCOUNTER — Other Ambulatory Visit: Payer: Self-pay

## 2018-05-14 ENCOUNTER — Encounter: Payer: Self-pay | Admitting: Obstetrics and Gynecology

## 2018-05-14 ENCOUNTER — Ambulatory Visit: Payer: 59 | Admitting: Obstetrics and Gynecology

## 2018-05-14 VITALS — BP 114/70 | HR 64 | Temp 97.8°F | Resp 14 | Ht 65.25 in | Wt 197.0 lb

## 2018-05-14 DIAGNOSIS — R159 Full incontinence of feces: Secondary | ICD-10-CM

## 2018-05-14 DIAGNOSIS — R350 Frequency of micturition: Secondary | ICD-10-CM | POA: Diagnosis not present

## 2018-05-14 LAB — POCT URINALYSIS DIPSTICK
BILIRUBIN UA: NEGATIVE
Blood, UA: NEGATIVE
GLUCOSE UA: NEGATIVE
KETONES UA: NEGATIVE
Nitrite, UA: NEGATIVE
Protein, UA: NEGATIVE
Urobilinogen, UA: 0.2 E.U./dL
pH, UA: 7 (ref 5.0–8.0)

## 2018-05-14 MED ORDER — CEPHALEXIN 500 MG PO CAPS: 500.0000 mg | ORAL_CAPSULE | Freq: Two times a day (BID) | ORAL | 0 refills | Status: DC

## 2018-05-14 NOTE — Patient Instructions (Addendum)

## 2018-05-14 NOTE — Telephone Encounter (Signed)
Reviewed with Dr. Quincy Simmonds. Call returned to patient. OV scheduled for today at 2:45pm with Dr. Quincy Simmonds. Patient aware will be worked into scheduled. Patient verbalizes understanding and is agreeable.   Encounter closed.

## 2018-05-14 NOTE — Telephone Encounter (Signed)
Spoke with patient. Requesting OV for UTI symptoms. Reports pressure and cloudy urine. Same symptoms as previous UTI 01/2018.   Denies blood in urine, fever/chills, lower back pain, N/V.   Advised Dr. Sabra Heck is out of the office today, patient agreeable to seeing covering provider. Advised will review with schedule with Dr. Quincy Simmonds and return call, patient agreeable.

## 2018-05-14 NOTE — Progress Notes (Signed)
GYNECOLOGY  VISIT   HPI: 62 y.o.   Married  Caucasian  female   G2P0011 with [name] last menstrual period was 09/26/2009.   here for possible UTI; patient complains of having frequency, urgency, and pain/pressure with urination since Saturday. No blood in urine.  No fevers. No back pain.  No nausea or vomiting.   Seen by Evalee Mutton on 02/19/18 for UTI.  UC showed E Coli.  Tx with Bactrim DS po bid x 3 days.   States she does have a need to repeat wiping to control her BMs.  She is distressed by this.  She shares that her primary GYN, Dr. Sabra Heck as told her she has a weak rectal sphincter.  Urine: 2+ WBC and 7.0 pH  On Humira and MTX.  GYNECOLOGIC HISTORY: [name] last menstrual period was 09/26/2009. Contraception:  postmenopausal Menopausal hormone therapy:  none Last mammogram:  07-18-17 category c density birads:1 Last pap smear:   06-21-17 neg HPV HR neg        OB History    Gravida  2   Para  1   Term      Preterm      AB  1   Living  1     SAB  1   TAB      Ectopic      Multiple      Live Births                 Patient Active Problem List   Diagnosis Date Noted  . Osteopenia of both hips 07/30/2017  . Osteoporosis without current pathological fracture 07/30/2017  . RA (rheumatoid arthritis) ([place]) 06/21/2017    Past Medical History:  Diagnosis Date  . 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 Osteoporosis   . 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 Gemma Payor in [place]    Past Surgical History:  Procedure Laterality Date  . CERVICAL POLYPECTOMY  2003  . tonselectomy     at age 84  . TUBAL LIGATION     BTL    Current Outpatient Medications  Medication Sig Dispense Refill  . Calcium Citrate-Vitamin D (CALCIUM + D PO) Take by mouth daily.    . folic acid (FOLVITE) 962 MCG tablet Take 800 mcg by mouth daily.    Marland Kitchen HUMIRA PEN 40 MG/0.8ML PNKT     . methotrexate (RHEUMATREX) 2.5 MG tablet   0   No current facility-administered medications for this  visit.      ALLERGIES: Levaquin [levofloxacin in d5w]; Macrobid [nitrofurantoin macrocrystal]; and Ciprofloxacin  Family History  Problem Relation Age of Onset  . Heart disease Mother   . Hyperlipidemia Brother   . Heart disease Maternal Grandmother   . Cancer - Lung Maternal Grandfather   . Brain cancer Paternal Grandmother   . Breast cancer Maternal Aunt     Social History   Socioeconomic History  . Marital status: Married    Spouse name: Not on file  . Number of children: Not on file  . Years of education: Not on file  . Highest education level: Not on file  Occupational History  . Not on file  Social Needs  . Financial resource strain: Not on file  . Food insecurity:    Worry: Not on file    Inability: Not on file  . Transportation needs:    Medical: Not on file    Non-medical: Not on file  Tobacco Use  . Smoking status: Never Smoker  . Smokeless  tobacco: Never Used  Substance and Sexual Activity  . Alcohol use: Yes    Comment: 1 a month  . Drug use: No  . Sexual activity: Yes    Birth control/protection: Other-see comments    Comment: BTL  Lifestyle  . Physical activity:    Days per week: Not on file    Minutes per session: Not on file  . Stress: Not on file  Relationships  . Social connections:    Talks on phone: Not on file    Gets together: Not on file    Attends religious service: Not on file    Active member of club or organization: Not on file    Attends meetings of clubs or organizations: Not on file    Relationship status: Not on file  . Intimate partner violence:    Fear of current or ex partner: Not on file    Emotionally abused: Not on file    Physically abused: Not on file    Forced sexual activity: Not on file  Other Topics Concern  . Not on file  Social History Narrative  . Not on file    Review of Systems  Constitutional: Negative.   HENT: Negative.   Eyes: Negative.   Respiratory: Negative.   Cardiovascular: Negative.    Gastrointestinal: Negative.   Endocrine: Negative.   Genitourinary: Positive for frequency and urgency.       Pain/pressure with urination  Musculoskeletal: Negative.   Skin: Negative.   Allergic/Immunologic: Negative.   Neurological: Negative.   Hematological: Negative.   Psychiatric/Behavioral: Negative.     PHYSICAL EXAMINATION:    BP 114/70 (BP Location: Right Arm, Patient Position: Sitting, Cuff Size: Normal)   Pulse 64   Temp 97.8 F (36.6 C) (Oral)   Resp 14   Ht 5' 5.25" (1.657 m)   Wt 197 lb (89.4 kg)   LMP 09/26/2009   BMI 32.53 kg/m     General appearance: alert, cooperative and appears stated age Head: Normocephalic, without obvious abnormality, atraumatic Neck: no adenopathy, supple, symmetrical, trachea midline and thyroid normal to inspection and palpation Lungs: clear to auscultation bilaterally Heart: regular rate and rhythm Abdomen: soft, non-tender, no masses,  no organomegaly Extremities: extremities normal, atraumatic, no cyanosis or edema No abnormal inguinal nodes palpated Neurologic: Grossly normal  Pelvic: External genitalia:  no lesions              Urethra:  normal appearing urethra with no masses, tenderness or lesions              Bartholins and Skenes: normal                 Vagina: normal appearing vagina with normal color and discharge, no lesions              Cervix: no lesions                Bimanual Exam:  Uterus:  normal size, contour, position, consistency, mobility, non-tender              Adnexa: no mass, fullness, tenderness          Chaperone was present for exam.  ASSESSMENT  UTI.  Possible recurrence.  Fecal incontinence.  PLAN  Urine micro and and culture.  Keflex 500 mg po bid x 7 days.  Referral for pelvic floor PT. Metamucil.  She will hold off on vaginal estrogen cream at this time as a means of reducing UTIs.  An After Visit Summary was printed and given to the patient.  ___25___ minutes face to face time  of which over 50% was spent in counseling.

## 2018-05-14 NOTE — Telephone Encounter (Signed)
Patient would like to come in today or tomorrow because of uti symptoms.

## 2018-05-15 LAB — URINALYSIS, MICROSCOPIC ONLY
Bacteria, UA: NONE SEEN
Casts: NONE SEEN /lpf

## 2018-05-16 LAB — URINE CULTURE

## 2018-07-23 ENCOUNTER — Encounter: Payer: Self-pay | Admitting: Physical Therapy

## 2018-07-23 ENCOUNTER — Ambulatory Visit: Payer: 59 | Attending: Internal Medicine | Admitting: Physical Therapy

## 2018-07-23 DIAGNOSIS — M6281 Muscle weakness (generalized): Secondary | ICD-10-CM | POA: Diagnosis present

## 2018-07-23 DIAGNOSIS — M25541 Pain in joints of right hand: Secondary | ICD-10-CM | POA: Insufficient documentation

## 2018-07-23 NOTE — Patient Instructions (Signed)
Access Code: [email or web address]  URL: [email or web address]  Date: 07/23/2018  Prepared by: Lum Babe   Exercises  Standing Hip Abduction with Theraband Resistance - 10 reps - 3 sets - 2 hold - 1x daily - 7x weekly  Shoulder extension with resistance - Neutral - 10 reps - 3 sets - 2 hold - 1x daily - 7x weekly  Theraband Wobble Board Hip Extension with Band - 10 reps - 3 sets - 2 hold - 1x daily - 7x weekly  Standing Row with Resistance - 10 reps - 3 sets - 2 hold - 1x daily - 7x weekly  Bridge with Abduction and Resistance Loop - 10 reps - 3 sets - 2 hold - 1x daily - 7x weekly  Standing Shoulder External Rotation with Resistance - 10 reps - 3 sets - 2 hold - 1x daily - 7x weekly  Standing Serratus Punch with Resistance - 10 reps - 3 sets - 2 hold - 1x daily - 7x weekly  Standing Repeated Hip Flexion with Resistance - 10 reps - 3 sets - 2 hold - 1x daily - 7x weekly  Standing Bicep Curls Supinated with Dumbbells - 10 reps - 3 sets - 2 hold - 1x daily - 7x weekly  CLX Tricep Pressdowns - 10 reps - 3 sets - 2 hold - 1x daily - 7x weekly  Standing Bent Over Triceps Extension - 10 reps - 3 sets - 2 hold - 1x daily - 7x weekly

## 2018-07-23 NOTE — Therapy (Signed)
[name], [place], [address] Phone: [phone]   Fax:  [phone]  Physical Therapy Evaluation  Patient Details  Name: Jill Lara MRN: [id number] Date of Birth: Aug 12, 1956 Referring Provider: Amil Amen   Encounter Date: 07/23/2018  PT End of Session - 07/23/18 0929    Visit Number  1    Date for PT Re-Evaluation  09/22/18    PT Start Time  0923    PT Stop Time  0835    PT Time Calculation (min)  38 min    Activity Tolerance  Patient tolerated treatment well    Behavior During Therapy  [facility] for tasks assessed/performed       Past Medical History:  Diagnosis Date  . 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 Osteoporosis   . 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 Gemma Payor in [place]    Past Surgical History:  Procedure Laterality Date  . CERVICAL POLYPECTOMY  2003  . tonselectomy     at age 80  . TUBAL LIGATION     BTL    There were no vitals filed for this visit.   Subjective Assessment - 07/23/18 0802    Subjective  Patient reports that in 2015 she had an adverse reaction to a medication with a loss of mm mass and use of the UE's.  She reports that she has RA of the wrists and hands, she reports pain in the wrists and hands and weakness in the UE's.    Limitations  Lifting;Writing;House hold activities    Patient Stated Goals  have more strength in the arms and hands, be able to do exercises at home    Currently in Pain?  Yes    Pain Score  2     Pain Location  Hand    Pain Orientation  Right;Left    Pain Descriptors / Indicators  Aching;Sore    Pain Type  Chronic pain    Pain Onset  More than a month ago    Pain Frequency  Constant    Aggravating Factors   gripping, lifting pain up to 6/10    Pain Relieving Factors  medications help. pain a 1-2/10 at most times at rest    Effect of Pain on Daily Activities  limits every ADL including dressing, brushing teeth         [facility] PT Assessment -  07/23/18 0001      Assessment   Medical Diagnosis  RA in joints of wrists and hands    Referring Provider  Be[place]man    Onset Date/Surgical Date  06/22/18    Hand Dominance  Right    Prior Therapy  5 years ago       Precautions   Precautions  None      Balance Screen   Has the patient fallen in the past 6 months  No    Has the patient had a decrease in activity level because of a fear of falling?   No    Is the patient reluctant to leave their home because of a fear of falling?   No      Home Environment   Additional Comments  some light housework, some gardening      Prior Function   Level of Independence  Independent    Vocation  Full time employment    Vocation Requirements  on computer    Leisure  no exercise      ROM /  Strength   AROM / PROM / Strength  AROM;Strength      AROM   Overall AROM Comments  has some pain with thum opposistion, can get to base of 5th finger, can make a fist with both hands    AROM Assessment Site  Wrist;Finger    Right/Left Wrist  Left    Right Wrist Extension  65 Degrees    Right Wrist Flexion  60 Degrees    Left Wrist Extension  50 Degrees    Left Wrist Flexion  60 Degrees    Right/Left Finger  Left      Strength   Overall Strength Comments  right 50#, left 54#, shoulders 4/5, elbows 4-/5, wrists 4-/5      Palpation   Palpation comment  some tenderness in the wrists                Objective measurements completed on examination: See above findings.                PT Short Term Goals - 07/23/18 0935      PT SHORT TERM GOAL #1   Title  independent with initial HEP    Time  2    Period  Weeks    Status  New        PT Long Term Goals - 07/23/18 0936      PT LONG TERM GOAL #1   Title  independent and safe with advanced HEP    Time  8    Period  Weeks    Status  New      PT LONG TERM GOAL #2   Title  start a walking program    Time  8    Period  Weeks    Status  New             Plan -  07/23/18 0930    Clinical Impression Statement  Patient had a bad reaction to a medication about 4 years ago she lost strength in her UE's.  She now has a diagnosis of RA in her hands as well as worries about osteoporosis and osteopenia.  Wanting to strnegthen her UE's and have a good exercise program.  Her shoulder ROM ans strength were WFL's, elbow strength 4-/5, wrist ROM was limited with strength being 4-/5.  Grips strength was 50# bilaterally.  She reports difficulty picking things up and fear of exercise that may hurt her.  Wants to do HEP and not go to a gym    Clinical Presentation  Stable    Clinical Decision Making  Low    Rehab Potential  Good    PT Frequency  1x / week    PT Duration  8 weeks    PT Treatment/Interventions  ADLs/Self Care Home Management;Cryotherapy;Electrical Stimulation;Moist Heat;Therapeutic exercise;Therapeutic activities;Patient/family education;Manual techniques    PT Next Visit Plan  gave a thorough HEP today, she is to try for 2 weeks and then return for tweaking and advancement    Consulted and Agree with Plan of Care  Patient       Patient will benefit from skilled therapeutic intervention in order to improve the following deficits and impairments:  Decreased range of motion, Impaired UE functional use, Decreased activity tolerance, Pain, Impaired flexibility, Decreased strength  Visit Diagnosis: Pain in joints of right hand - Plan: PT plan of care cert/re-cert  Muscle weakness (generalized) - Plan: PT plan of care cert/re-cert     Problem List Patient Active Problem  List   Diagnosis Date Noted  . Osteopenia of both hips 07/30/2017  . Osteoporosis without current pathological fracture 07/30/2017  . RA (rheumatoid arthritis) ([place]) 06/21/2017    Sumner Boast., PT 07/23/2018, 9:38 AM  [place] [address] [facility] [place], [place], [address] Phone: [phone]   Fax:   [phone]  Name: Jill Lara MRN: [id number] Date of Birth: 1956-02-09

## 2018-08-11 ENCOUNTER — Encounter: Payer: Self-pay | Admitting: Physical Therapy

## 2018-08-11 ENCOUNTER — Ambulatory Visit: Payer: 59 | Attending: Internal Medicine | Admitting: Physical Therapy

## 2018-08-11 DIAGNOSIS — M6281 Muscle weakness (generalized): Secondary | ICD-10-CM | POA: Diagnosis present

## 2018-08-11 DIAGNOSIS — M25541 Pain in joints of right hand: Secondary | ICD-10-CM | POA: Diagnosis present

## 2018-08-11 NOTE — Therapy (Signed)
[place], [place], [address] Phone: [phone]   Fax:  [phone]  Physical Therapy Treatment  Patient Details  Name: Jill Lara MRN: [id number] Date of Birth: 03/17/1956 Referring Provider: Amil Amen   Encounter Date: 08/11/2018  PT End of Session - 08/11/18 0930    Visit Number  2    Date for PT Re-Evaluation  09/22/18    PT Start Time  0844    PT Stop Time  0929    PT Time Calculation (min)  45 min    Activity Tolerance  Patient tolerated treatment well    Behavior During Therapy  [facility] for tasks assessed/performed       Past Medical History:  Diagnosis Date  . 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 Osteoporosis   . 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 Gemma Payor in [place]    Past Surgical History:  Procedure Laterality Date  . CERVICAL POLYPECTOMY  2003  . tonselectomy     at age 27  . TUBAL LIGATION     BTL    There were no vitals filed for this visit.  Subjective Assessment - 08/11/18 0846    Subjective  Patient reports that she is doing okay reports that some of the exercises do cause pain in the hands, she got a membership to a gym    Currently in Pain?  Yes    Pain Score  2     Pain Location  Hand    Aggravating Factors   gripping and lifting                       [facility] Adult PT Treatment/Exercise - 08/11/18 0001      Ambulation/Gait   Gait Comments  gait around the building fast pace      Exercises   Exercises  Shoulder      Shoulder Exercises: ROM/Strengthening   UBE (Upper Arm Bike)  4 minutes    Nustep  Level 4 4 minutes    Lat Pull  1.5 plate;20 reps    Cybex Row  1.5 plate;20 reps    Other ROM/Strengthening Exercises  leg extension 5#, leg curls 20# 2x10    Other ROM/Strengthening Exercises  elliptical R=5 I=10 x 3 minutes             PT Education - 08/11/18 0929    Education Details  reviewed HEP, also talked aobut the gym exercises she could do,  form, weight and reps    Person(s) Educated  Patient    Methods  Explanation;Demonstration;Tactile cues;Verbal cues    Comprehension  Verbalized understanding;Returned demonstration;Verbal cues required;Tactile cues required       PT Short Term Goals - 08/11/18 0933      PT SHORT TERM GOAL #1   Title  independent with initial HEP    Status  Achieved        PT Long Term Goals - 07/23/18 0936      PT LONG TERM GOAL #1   Title  independent and safe with advanced HEP    Time  8    Period  Weeks    Status  New      PT LONG TERM GOAL #2   Title  start a walking program    Time  8    Period  Weeks    Status  New            Plan -  08/11/18 0932    Clinical Impression Statement  Patient is very afraid of going to the gym, she is unsure of what machines to use or try and how to do, afraid that "I will hurt my self, "  She ha concerns about RA and osteopenia.  I went over how good exercises are for this, she just needs a lot of instruciton as she is one who has never been to a gym    PT Next Visit Plan  she is to try gym and come back in 2-3 weeks    Consulted and Agree with Plan of Care  Patient       Patient will benefit from skilled therapeutic intervention in order to improve the following deficits and impairments:  Decreased range of motion, Impaired UE functional use, Decreased activity tolerance, Pain, Impaired flexibility, Decreased strength  Visit Diagnosis: Pain in joints of right hand  Muscle weakness (generalized)     Problem List Patient Active Problem List   Diagnosis Date Noted  . Osteopenia of both hips 07/30/2017  . Osteoporosis without current pathological fracture 07/30/2017  . RA (rheumatoid arthritis) ([place]) 06/21/2017    Sumner Boast., PT 08/11/2018, 9:34 AM  [place] [address] [place], [place], [address] Phone: [phone]   Fax:  [phone]  Name: Jill Lara MRN: [id number] Date of Birth: [birth year]

## 2018-08-19 ENCOUNTER — Encounter: Payer: Self-pay | Admitting: Obstetrics & Gynecology

## 2018-08-19 ENCOUNTER — Other Ambulatory Visit (HOSPITAL_COMMUNITY)
Admission: RE | Admit: 2018-08-19 | Discharge: 2018-08-19 | Disposition: A | Discharge status: Home / Self Care | Payer: 59 | Source: Ambulatory Visit | Attending: Obstetrics & Gynecology | Admitting: Obstetrics & Gynecology

## 2018-08-19 ENCOUNTER — Ambulatory Visit (INDEPENDENT_AMBULATORY_CARE_PROVIDER_SITE_OTHER): Payer: 59 | Admitting: Obstetrics & Gynecology

## 2018-08-19 VITALS — BP 110/70 | HR 64 | Resp 16 | Ht 65.25 in | Wt 197.0 lb

## 2018-08-19 DIAGNOSIS — Z01419 Encounter for gynecological examination (general) (routine) without abnormal findings: Secondary | ICD-10-CM | POA: Diagnosis not present

## 2018-08-19 DIAGNOSIS — Z205 Contact with and (suspected) exposure to viral hepatitis: Secondary | ICD-10-CM | POA: Insufficient documentation

## 2018-08-19 DIAGNOSIS — Z0000 Encounter for general adult medical examination without abnormal findings: Secondary | ICD-10-CM

## 2018-08-19 DIAGNOSIS — Z124 Encounter for screening for malignant neoplasm of cervix: Secondary | ICD-10-CM

## 2018-08-19 NOTE — Patient Instructions (Addendum)
[facility].  Every provider is internal medicine.  Jill Lara, [place] at [facility] at [facility] [address], [place], [place] [address]  [place] Drs. Lowne, Copland (female) and Dr. Larose Kells (female)

## 2018-08-19 NOTE — Progress Notes (Addendum)
62 y.o. G89P001 Married Other or two or more races female here for annual exam.  Is doing physical to help with muscle strengthening.  Seeing Dr. Amil Amen.  Is happy with this change.    Had some recurrent UTI issues this past year.  Doing pelvic PT now.     Denies vaginal bleeding.    [name] last menstrual period was 09/26/2009.          Sexually active: Yes.    The current method of family planning is post menopausal status.    Exercising: No.   Smoker:  no  Health Maintenance: Pap:  06/21/17 Neg. HR HPV:neg  History of abnormal Pap:  no MMG:  07/19/17 BIRADS1:Neg. Will call to schedule   Colonoscopy:  Never.  Willing to do a cologuard. BMD:   07/18/17 Osteoporosis  TDaP:  ~2015 (pt is sure she is UTD with this) Pneumonia vaccine(s):  Not indicated Shingrix:  Declines Hep C testing: No Screening Labs: rheumatology    reports that she has never smoked. She has never used smokeless tobacco. She reports that she drinks about 1.0 standard drinks of alcohol per week. She reports that she does not use drugs.  Past Medical History:  Diagnosis Date  . 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 Osteoporosis   . 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 Gemma Payor in [place]    Past Surgical History:  Procedure Laterality Date  . CERVICAL POLYPECTOMY  2003  . tonselectomy     at age 81  . TUBAL LIGATION     BTL    Current Outpatient Medications  Medication Sig Dispense Refill  . Calcium Citrate-Vitamin D (CALCIUM + D PO) Take by mouth daily.    . folic acid (FOLVITE) 761 MCG tablet Take 800 mcg by mouth daily.    Marland Kitchen HUMIRA PEN 40 MG/0.8ML PNKT     . methotrexate (RHEUMATREX) 2.5 MG tablet   0   No current facility-administered medications for this visit.     Family History  Problem Relation Age of Onset  . Heart disease Mother   . Hyperlipidemia Brother   . Heart disease Maternal Grandmother   . Cancer - Lung Maternal Grandfather   . Brain cancer Paternal Grandmother   . Breast cancer Maternal Aunt      Review of Systems  All other systems reviewed and are negative.   Exam:   BP 110/70 (BP Location: Right Arm, Patient Position: Sitting, Cuff Size: Large)   Pulse 64   Resp 16   Ht 5' 5.25" (1.657 m)   Wt 197 lb (89.4 kg)   LMP 09/26/2009   BMI 32.53 kg/m    Height: 5' 5.25" (165.7 cm)  Ht Readings from Last 3 Encounters:  08/19/18 5' 5.25" (1.657 m)  05/14/18 5' 5.25" (1.657 m)  02/19/18 5' 5.25" (1.657 m)    General appearance: alert, cooperative and appears stated age Head: Normocephalic, without obvious abnormality, atraumatic Neck: no adenopathy, supple, symmetrical, trachea midline and thyroid normal to inspection and palpation Lungs: clear to auscultation bilaterally Breasts: normal appearance, no masses or tenderness Heart: regular rate and rhythm Abdomen: soft, non-tender; bowel sounds normal; no masses,  no organomegaly Extremities: extremities normal, atraumatic, no cyanosis or edema Skin: Skin color, texture, turgor normal. No rashes or lesions Lymph nodes: Cervical, supraclavicular, and axillary nodes normal. No abnormal inguinal nodes palpated Neurologic: Grossly normal   Pelvic: External genitalia:  no lesions              Urethra:  normal appearing urethra with no masses, tenderness or lesions              Bartholins and Skenes: normal                 Vagina: normal appearing vagina with normal color and discharge, no lesions              Cervix: no lesions              Pap taken: Yes.   Bimanual Exam:  Uterus:  normal size, contour, position, consistency, mobility, non-tender              Adnexa: normal adnexa and no mass, fullness, tenderness               Rectovaginal: Confirms               Anus:  normal sphincter tone, no lesions  Chaperone was present for exam.  A:  Well Woman with normal exam PMP, no HRT RA Osteoporosis  P:   Mammogram guidelines reviewed.  Pt is going to schedule her own MMG Names of PCPs given to pt.  She will call to  scheduled Cologuard ordered Vaccinations discussed.  Declines Shingrix at this time. Lab work obtained today: CBC, CMP, Lipids, TSH, Vit D, HepC  (non fasting) pap smear obtained due to being on immunosuppressant.  Neg pap with neg HR HPV was obtained 2018. return annually or prn

## 2018-08-20 LAB — COMPREHENSIVE METABOLIC PANEL
ALK PHOS: 60 IU/L (ref 39–117)
ALT: 35 IU/L — AB (ref 0–32)
AST: 42 IU/L — ABNORMAL HIGH (ref 0–40)
Albumin/Globulin Ratio: 2.2 (ref 1.2–2.2)
Albumin: 4.8 g/dL (ref 3.6–4.8)
BILIRUBIN TOTAL: 0.5 mg/dL (ref 0.0–1.2)
BUN/Creatinine Ratio: 19 (ref 12–28)
BUN: 15 mg/dL (ref 8–27)
CALCIUM: 9.5 mg/dL (ref 8.7–10.3)
CHLORIDE: 104 mmol/L (ref 96–106)
CO2: 24 mmol/L (ref 20–29)
Creatinine, Ser: 0.81 mg/dL (ref 0.57–1.00)
GFR calc Af Amer: 89 mL/min/{1.73_m2} (ref >59)
GFR calc non Af Amer: 78 mL/min/{1.73_m2} (ref >59)
GLOBULIN, TOTAL: 2.2 g/dL (ref 1.5–4.5)
Glucose: 78 mg/dL (ref 65–99)
Potassium: 4.7 mmol/L (ref 3.5–5.2)
Sodium: 145 mmol/L — ABNORMAL HIGH (ref 134–144)
Total Protein: 7 g/dL (ref 6.0–8.5)

## 2018-08-20 LAB — LIPID PANEL
CHOLESTEROL TOTAL: 195 mg/dL (ref 100–199)
Chol/HDL Ratio: 3.8 ratio (ref 0.0–4.4)
HDL: 51 mg/dL (ref >39)
LDL Calculated: 123 mg/dL — ABNORMAL HIGH (ref 0–99)
Triglycerides: 103 mg/dL (ref 0–149)
VLDL CHOLESTEROL CAL: 21 mg/dL (ref 5–40)

## 2018-08-20 LAB — CBC
HEMATOCRIT: 39.9 % (ref 34.0–46.6)
HEMOGLOBIN: 12.9 g/dL (ref 11.1–15.9)
MCH: 29.5 pg (ref 26.6–33.0)
MCHC: 32.3 g/dL (ref 31.5–35.7)
MCV: 91 fL (ref 79–97)
Platelets: 324 10*3/uL (ref 150–450)
RBC: 4.38 x10E6/uL (ref 3.77–5.28)
RDW: 13.8 % (ref 12.3–15.4)
WBC: 5.3 10*3/uL (ref 3.4–10.8)

## 2018-08-20 LAB — TSH: TSH: 2.06 u[IU]/mL (ref 0.450–4.500)

## 2018-08-20 LAB — HEPATITIS C ANTIBODY

## 2018-08-20 LAB — VITAMIN D 25 HYDROXY (VIT D DEFICIENCY, FRACTURES): VIT D 25 HYDROXY: 35 ng/mL (ref 30.0–100.0)

## 2018-08-21 LAB — CYTOLOGY - PAP: Diagnosis: NEGATIVE

## 2018-08-27 ENCOUNTER — Encounter: Payer: Self-pay | Admitting: Physical Therapy

## 2018-08-27 ENCOUNTER — Ambulatory Visit: Payer: 59 | Attending: Internal Medicine | Admitting: Physical Therapy

## 2018-08-27 DIAGNOSIS — M6281 Muscle weakness (generalized): Secondary | ICD-10-CM

## 2018-08-27 DIAGNOSIS — M25541 Pain in joints of right hand: Secondary | ICD-10-CM | POA: Diagnosis not present

## 2018-08-27 NOTE — Therapy (Signed)
[address] [place], [place], [address] Phone: [phone]   Fax:  [phone]  Physical Therapy Treatment  Patient Details  Name: Jill Lara MRN: [id number] Date of Birth: 03/21/1956 Referring Provider (PT): Amil Amen   Encounter Date: 08/27/2018  PT End of Session - 08/27/18 0843    Visit Number  3    Date for PT Re-Evaluation  09/22/18    PT Start Time  0800    PT Stop Time  0856    PT Time Calculation (min)  56 min    Activity Tolerance  Patient tolerated treatment well    Behavior During Therapy  [facility] for tasks assessed/performed       Past Medical History:  Diagnosis Date  . 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 Osteoporosis   . 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 Gemma Payor in [place]    Past Surgical History:  Procedure Laterality Date  . CERVICAL POLYPECTOMY  2003  . TONSILLECTOMY  1958  . TUBAL LIGATION     BTL    There were no vitals filed for this visit.  Subjective Assessment - 08/27/18 0812    Subjective  Patient went to the gym, they were not helpful, she found some of the machines but could not figure them out, reports she needs help    Currently in Pain?  No/denies                       [facility] Adult PT Treatment/Exercise - 08/27/18 0001      Ambulation/Gait   Gait Comments  gait around the building fast pace      Shoulder Exercises: ROM/Strengthening   UBE (Upper Arm Bike)  4 minutes, Ellipticalk R=5, I=10 x 2 minutes    Nustep  Level 4 4 minutes    Lat Pull  1.5 plate;20 reps    Cybex Row  1.5 plate;20 reps    Other ROM/Strengthening Exercises  leg extension 5#, leg curls 20# 2x10, chest press 5#    Other ROM/Strengthening Exercises  resisted gait in hall all directions             PT Education - 08/27/18 0843    Education Details  HEP with feet on ball and isometric abs    Person(s) Educated  Patient    Methods  Explanation;Demonstration;Handout    Comprehension  Verbalized understanding       PT Short Term Goals - 08/11/18 0933      PT SHORT TERM GOAL #1   Title  independent with initial HEP    Status  Achieved        PT Long Term Goals - 08/27/18 4270      PT LONG TERM GOAL #1   Title  independent and safe with advanced HEP    Status  On-going      PT LONG TERM GOAL #2   Title  start a walking program    Status  On-going            Plan - 08/27/18 0844    Clinical Impression Statement  Patient having a hard time feeling good about the gym on her own, she was able to find some of the machines but not all and had some difficulty adjusting some of them.    PT Next Visit Plan  she is to try to go to a gym orientation, she may take pix of the equipment as return so  we can discuss    Consulted and Agree with Plan of Care  Patient       Patient will benefit from skilled therapeutic intervention in order to improve the following deficits and impairments:  Decreased range of motion, Impaired UE functional use, Decreased activity tolerance, Pain, Impaired flexibility, Decreased strength  Visit Diagnosis: Pain in joints of right hand  Muscle weakness (generalized)     Problem List Patient Active Problem List   Diagnosis Date Noted  . Osteopenia of both hips 07/30/2017  . Osteoporosis without current pathological fracture 07/30/2017  . RA (rheumatoid arthritis) ([place]) 06/21/2017    Sumner Boast ., PT 08/27/2018, 8:53 AM  [address] [place], [place], [address] Phone: [phone]   Fax:  [phone]  Name: PARISH AUGUSTINE MRN: [id number] Date of Birth: 08/11/1956

## 2018-09-09 ENCOUNTER — Ambulatory Visit: Payer: 59 | Admitting: Physical Therapy

## 2018-09-24 ENCOUNTER — Encounter: Payer: Self-pay | Admitting: Physical Therapy

## 2018-09-24 ENCOUNTER — Ambulatory Visit: Payer: 59 | Admitting: Physical Therapy

## 2018-09-24 DIAGNOSIS — M25541 Pain in joints of right hand: Secondary | ICD-10-CM

## 2018-09-24 DIAGNOSIS — M6281 Muscle weakness (generalized): Secondary | ICD-10-CM

## 2018-09-24 NOTE — Therapy (Signed)
[address] [place], [place], [address] Phone: [phone]   Fax:  [phone]  Physical Therapy Treatment  Patient Details  Name: Jill Lara MRN: [id number] Date of Birth: 1956-05-20 Referring Provider (PT): Amil Amen   Encounter Date: 09/24/2018  PT End of Session - 09/24/18 1142    Visit Number  4    Date for PT Re-Evaluation  09/22/18    PT Start Time  0842    PT Stop Time  0930    PT Time Calculation (min)  48 min    Activity Tolerance  Patient tolerated treatment well    Behavior During Therapy  [facility] for tasks assessed/performed       Past Medical History:  Diagnosis Date  . 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 Osteoporosis   . 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 Gemma Payor in [place]    Past Surgical History:  Procedure Laterality Date  . CERVICAL POLYPECTOMY  2003  . TONSILLECTOMY  1958  . TUBAL LIGATION     BTL    There were no vitals filed for this visit.  Subjective Assessment - 09/24/18 0850    Subjective  Reports that she had a gym employee help her with the machines, she reports that she feels a little better but has not been in    Currently in Pain?  No/denies                       [facility] Adult PT Treatment/Exercise - 09/24/18 0001      Shoulder Exercises: ROM/Strengthening   UBE (Upper Arm Bike)  4 minutes, Elliptical R=5, I=10 x 2 minutes    Nustep  Level 5 6 minutes    Lat Pull  2 plate;20 reps    Cybex Row  2 plate;20 reps    Other ROM/Strengthening Exercises  leg extension 5#, leg curls 25# 2x10, chest press 5#    Other ROM/Strengthening Exercises  went over the gym programs and exercises, what to avoid and when to stop, how to challenge herself               PT Short Term Goals - 08/11/18 0933      PT SHORT TERM GOAL #1   Title  independent with initial HEP    Status  Achieved        PT Long Term Goals - 09/24/18 1144      PT LONG TERM GOAL #1   Title  independent and safe with advanced HEP    Status  Achieved      PT LONG TERM GOAL #2   Title  start a walking program    Status  Achieved            Plan - 09/24/18 1143    Clinical Impression Statement  Patient reports feeling better about the gym, she brought in a list of classes and we went over whet they were and if they would be good for her, she is feeling good about adjusting the machines as I had her adjust all the weights and the machines today with minimal guidance., reports that he pain is better overall.    PT Next Visit Plan  D/C with goals met    Consulted and Agree with Plan of Care  Patient       Patient will benefit from skilled therapeutic intervention in order to improve the following deficits and impairments:  Decreased range of  motion, Impaired UE functional use, Decreased activity tolerance, Pain, Impaired flexibility, Decreased strength  Visit Diagnosis: Pain in joints of right hand  Muscle weakness (generalized)     Problem List Patient Active Problem List   Diagnosis Date Noted  . Osteopenia of both hips 07/30/2017  . Osteoporosis without current pathological fracture 07/30/2017  . RA (rheumatoid arthritis) ([place]) 06/21/2017    Sumner Boast., PT 09/24/2018, 11:45 AM  [address] [place], [place], [address] Phone: [phone]   Fax:  [phone]  Name: Jill Lara MRN: [id number] Date of Birth: 1956-09-19

## 2018-12-23 ENCOUNTER — Other Ambulatory Visit: Payer: Self-pay | Admitting: Obstetrics & Gynecology

## 2018-12-23 DIAGNOSIS — Z1231 Encounter for screening mammogram for malignant neoplasm of breast: Secondary | ICD-10-CM

## 2019-01-21 LAB — COLOGUARD: Cologuard: NEGATIVE

## 2019-01-22 ENCOUNTER — Ambulatory Visit
Admission: RE | Admit: 2019-01-22 | Discharge: 2019-01-22 | Disposition: A | Discharge status: Home / Self Care | Payer: 59 | Source: Ambulatory Visit | Attending: Obstetrics & Gynecology | Admitting: Obstetrics & Gynecology

## 2019-01-22 DIAGNOSIS — Z1231 Encounter for screening mammogram for malignant neoplasm of breast: Secondary | ICD-10-CM

## 2019-01-29 ENCOUNTER — Other Ambulatory Visit: Payer: Self-pay | Admitting: *Deleted

## 2019-01-29 ENCOUNTER — Telehealth: Payer: Self-pay | Admitting: *Deleted

## 2019-01-29 DIAGNOSIS — Z1212 Encounter for screening for malignant neoplasm of rectum: Secondary | ICD-10-CM

## 2019-01-29 DIAGNOSIS — Z1211 Encounter for screening for malignant neoplasm of colon: Secondary | ICD-10-CM

## 2019-01-29 NOTE — Telephone Encounter (Signed)
Call to patient. Patient notified of negative Cologuard results and verbalized understanding.   Encounter closed.

## 2019-07-08 DIAGNOSIS — M25561 Pain in right knee: Secondary | ICD-10-CM | POA: Diagnosis not present

## 2019-07-08 DIAGNOSIS — M0579 Rheumatoid arthritis with rheumatoid factor of multiple sites without organ or systems involvement: Secondary | ICD-10-CM | POA: Diagnosis not present

## 2019-07-08 DIAGNOSIS — M810 Age-related osteoporosis without current pathological fracture: Secondary | ICD-10-CM | POA: Diagnosis not present

## 2019-12-30 DIAGNOSIS — Z20828 Contact with and (suspected) exposure to other viral communicable diseases: Secondary | ICD-10-CM | POA: Diagnosis not present

## 2019-12-31 ENCOUNTER — Ambulatory Visit: Payer: 59 | Admitting: Obstetrics & Gynecology

## 2020-02-11 ENCOUNTER — Ambulatory Visit: Payer: Self-pay | Admitting: Obstetrics & Gynecology

## 2020-02-11 DIAGNOSIS — M1712 Unilateral primary osteoarthritis, left knee: Secondary | ICD-10-CM | POA: Diagnosis not present

## 2020-02-16 ENCOUNTER — Encounter: Payer: Self-pay | Admitting: Certified Nurse Midwife

## 2020-02-18 DIAGNOSIS — M810 Age-related osteoporosis without current pathological fracture: Secondary | ICD-10-CM | POA: Diagnosis not present

## 2020-02-18 DIAGNOSIS — M25561 Pain in right knee: Secondary | ICD-10-CM | POA: Diagnosis not present

## 2020-02-18 DIAGNOSIS — M0579 Rheumatoid arthritis with rheumatoid factor of multiple sites without organ or systems involvement: Secondary | ICD-10-CM | POA: Diagnosis not present

## 2020-02-25 DIAGNOSIS — B029 Zoster without complications: Secondary | ICD-10-CM

## 2020-02-25 HISTORY — DX: Zoster without complications: B02.9

## 2020-03-07 DIAGNOSIS — B029 Zoster without complications: Secondary | ICD-10-CM | POA: Diagnosis not present

## 2020-03-10 NOTE — Progress Notes (Signed)
64 y.o. G87P0011 Married Other or two or more races female here for annual exam.  She just had shingles.  She took valtrex for this.    Did get the [facility] vaccination.  Did well with this vaccination.    Patient with 45lb weight loss!!  Saw Dr. Amil Amen within the last month.  Did blood work for liver enzymes at that visit.  Denies vaginal bleeding.    [name] last menstrual period was 09/26/2009.          Sexually active: Yes.    The current method of family planning is post menopausal status.    Exercising: Yes.    walking Smoker:  no  Health Maintenance: Pap: 08-19-18 Neg, 06-21-17 Neg:Neg HR HPV History of abnormal Pap:  no MMG: 01-22-19 3D/Neg/density B/BiRads1--patient states she will schedule Colonoscopy: 01-29-19 Neg cologuard -- never had colonoscopy BMD:   07-18-17 Osteoporosis TDaP:  ~2015 per pt hx Pneumonia vaccine(s):  no Shingrix:  Aware she should consider this in about a year Hep C testing: 08-19-18 Neg Screening Labs: had liver enzymes with Dr. Amil Amen   reports that she has never smoked. She has never used smokeless tobacco. She reports current alcohol use. She reports that she does not use drugs.  Past Medical History:  Diagnosis Date  . 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 Osteoporosis   . 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 Gemma Payor in [place]  . Shingles 02/2020   face    Past Surgical History:  Procedure Laterality Date  . CERVICAL POLYPECTOMY  2003  . TONSILLECTOMY  1958  . TUBAL LIGATION     BTL    Current Outpatient Medications  Medication Sig Dispense Refill  . Calcium Citrate-Vitamin D (CALCIUM + D PO) Take by mouth daily.    . folic acid (FOLVITE) Q000111Q MCG tablet Take 800 mcg by mouth daily.    Marland Kitchen HUMIRA PEN 40 MG/0.4ML PNKT SMARTSIG:40 Milligram(s) SUB-Q Every 2 Weeks    . methotrexate (RHEUMATREX) 2.5 MG tablet   0  . valACYclovir (VALTREX) 1000 MG tablet Take 1,000 mg by mouth 3 (three) times daily.     No current facility-administered medications for  this visit.    Family History  Problem Relation Age of Onset  . Heart disease Mother   . Hyperlipidemia Brother   . Heart attack Brother   . Heart disease Maternal Grandmother   . Breast cancer Maternal Grandmother   . Cancer - Lung Maternal Grandfather   . Brain cancer Paternal Grandmother   . Breast cancer Maternal Aunt     Review of Systems  All other systems reviewed and are negative.   Exam:   BP 132/80   Pulse 70   Temp (!) 97 F (36.1 C) (Temporal)   Resp 12   Ht 5\' 5"  (1.651 m)   Wt 154 lb 6.4 oz (70 kg)   LMP 09/26/2009   BMI 25.69 kg/m    Height: 5\' 5"  (165.1 cm)  Ht Readings from Last 3 Encounters:  03/14/20 5\' 5"  (1.651 m)  08/19/18 5' 5.25" (1.657 m)  05/14/18 5' 5.25" (1.657 m)    General appearance: alert, cooperative and appears stated age Head: Normocephalic, without obvious abnormality, atraumatic Neck: no adenopathy, supple, symmetrical, trachea midline and thyroid normal to inspection and palpation Lungs: clear to auscultation bilaterally Breasts: normal appearance, no masses or tenderness Heart: regular rate and rhythm Abdomen: soft, non-tender; bowel sounds normal; no masses,  no organomegaly Extremities: extremities normal, atraumatic, no cyanosis or edema  Skin: Skin color, texture, turgor normal. No rashes or lesions Lymph nodes: Cervical, supraclavicular, and axillary nodes normal. No abnormal inguinal nodes palpated Neurologic: Grossly normal   Pelvic: External genitalia:  no lesions              Urethra:  normal appearing urethra with no masses, tenderness or lesions              Bartholins and Skenes: normal                 Vagina: normal appearing vagina with normal color and discharge, no lesions              Cervix: no lesions              Pap taken: No. Bimanual Exam:  Uterus:  normal size, contour, position, consistency, mobility, non-tender              Adnexa: normal adnexa and no mass, fullness, tenderness                Rectovaginal: Confirms               Anus:  normal sphincter tone, no lesions  Chaperone, Marisa Sprinkles, CMA, was present for exam.  A:  Well Woman with normal exam PMP, no HRT Rheumatoid arthritis Osteoporosis   P:   Mammogram guidelines reviewed. She is going to schedule this. pap smear not indicated.  Neg RH HPV testing 20 D/w pt BMD.  Continues to decline treatment.  Will not repeat BMD this year. CBC, Lipids, TSh and HbA1C  (did have bacon about 5-6 hours ago) Vaccines reviewed.  Aware to star prevnar later this year.  Is going to wait a few months for this due to current shingles Return annually or prn

## 2020-03-11 ENCOUNTER — Ambulatory Visit: Payer: Self-pay | Admitting: Obstetrics & Gynecology

## 2020-03-14 ENCOUNTER — Encounter: Payer: Self-pay | Admitting: Obstetrics & Gynecology

## 2020-03-14 ENCOUNTER — Other Ambulatory Visit: Payer: Self-pay

## 2020-03-14 ENCOUNTER — Ambulatory Visit (INDEPENDENT_AMBULATORY_CARE_PROVIDER_SITE_OTHER): Payer: BC Managed Care – PPO | Admitting: Obstetrics & Gynecology

## 2020-03-14 VITALS — BP 132/80 | HR 70 | Temp 97.0°F | Resp 12 | Ht 65.0 in | Wt 154.4 lb

## 2020-03-14 DIAGNOSIS — Z01419 Encounter for gynecological examination (general) (routine) without abnormal findings: Secondary | ICD-10-CM

## 2020-03-14 DIAGNOSIS — Z0000 Encounter for general adult medical examination without abnormal findings: Secondary | ICD-10-CM

## 2020-03-15 LAB — CBC
Hematocrit: 41.6 % (ref 34.0–46.6)
Hemoglobin: 13.5 g/dL (ref 11.1–15.9)
MCH: 29.9 pg (ref 26.6–33.0)
MCHC: 32.5 g/dL (ref 31.5–35.7)
MCV: 92 fL (ref 79–97)
Platelets: 302 10*3/uL (ref 150–450)
RBC: 4.52 x10E6/uL (ref 3.77–5.28)
RDW: 13.8 % (ref 11.7–15.4)
WBC: 5.7 10*3/uL (ref 3.4–10.8)

## 2020-03-15 LAB — LIPID PANEL
Chol/HDL Ratio: 3.5 ratio (ref 0.0–4.4)
Cholesterol, Total: 189 mg/dL (ref 100–199)
HDL: 54 mg/dL (ref >39)
LDL Chol Calc (NIH): 118 mg/dL — ABNORMAL HIGH (ref 0–99)
Triglycerides: 91 mg/dL (ref 0–149)
VLDL Cholesterol Cal: 17 mg/dL (ref 5–40)

## 2020-03-15 LAB — HEMOGLOBIN A1C
Est. average glucose Bld gHb Est-mCnc: 100 mg/dL
Hgb A1c MFr Bld: 5.1 % (ref 4.8–5.6)

## 2020-03-15 LAB — TSH: TSH: 1.49 u[IU]/mL (ref 0.450–4.500)

## 2020-03-29 DIAGNOSIS — M1712 Unilateral primary osteoarthritis, left knee: Secondary | ICD-10-CM | POA: Diagnosis not present

## 2020-12-26 ENCOUNTER — Encounter: Payer: Self-pay | Admitting: Obstetrics and Gynecology

## 2020-12-26 ENCOUNTER — Other Ambulatory Visit: Payer: Self-pay

## 2020-12-26 ENCOUNTER — Ambulatory Visit: Payer: Medicare Other | Admitting: Obstetrics and Gynecology

## 2020-12-26 VITALS — BP 116/72 | HR 76 | Resp 16 | Wt 164.0 lb

## 2020-12-26 DIAGNOSIS — N3090 Cystitis, unspecified without hematuria: Secondary | ICD-10-CM | POA: Diagnosis not present

## 2020-12-26 DIAGNOSIS — R309 Painful micturition, unspecified: Secondary | ICD-10-CM | POA: Diagnosis not present

## 2020-12-26 MED ORDER — SULFAMETHOXAZOLE-TRIMETHOPRIM 800-160 MG PO TABS: 1.0000 | ORAL_TABLET | Freq: Two times a day (BID) | ORAL | 0 refills | Status: DC

## 2020-12-26 MED ORDER — PHENAZOPYRIDINE HCL 200 MG PO TABS: 200.0000 mg | ORAL_TABLET | Freq: Three times a day (TID) | ORAL | 0 refills | Status: DC | PRN

## 2020-12-26 NOTE — Progress Notes (Signed)
GYNECOLOGY  VISIT   HPI: 65 y.o.   Married Other or two or more races Not Hispanic or Latino  female   G2P0011 with [name] last menstrual period was 09/26/2009.   here for a UTI. Patient c/o having urinary frequency, pain with urination, and cloudy urine. Her symptoms started at the end of last week. She has frequency, some urgency, dysuria. No fevers, no flank pain.   The patient reports that her Rheumatologist took her off methotrexate secondary to anemia. F/U blood work was fine. She won't go back on the methotrexate until her Rheumatologist tells her its okay.   GYNECOLOGIC HISTORY: [name] last menstrual period was 09/26/2009. Contraception:PMP Menopausal hormone therapy: none        OB History    Gravida  2   Para  1   Term      Preterm      AB  1   Living  1     SAB  1   IAB      Ectopic      Multiple      Live Births                 Patient Active Problem List   Diagnosis Date Noted  . Osteopenia of both hips 07/30/2017  . Osteoporosis without current pathological fracture 07/30/2017  . RA (rheumatoid arthritis) ([place]) 06/21/2017    Past Medical History:  Diagnosis Date  . 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 Osteoporosis   . 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 Gemma Payor in [place]  . Shingles 02/2020   face    Past Surgical History:  Procedure Laterality Date  . CERVICAL POLYPECTOMY  2003  . TONSILLECTOMY  1958  . TUBAL LIGATION     BTL    Current Outpatient Medications  Medication Sig Dispense Refill  . Calcium Citrate-Vitamin D (CALCIUM + D PO) Take by mouth daily.    . folic acid (FOLVITE) 381 MCG tablet Take 800 mcg by mouth daily.    Marland Kitchen HUMIRA PEN 40 MG/0.4ML PNKT SMARTSIG:40 Milligram(s) SUB-Q Every 2 Weeks    . methotrexate (RHEUMATREX) 2.5 MG tablet   0   No current facility-administered medications for this visit.     ALLERGIES: Levaquin [levofloxacin in d5w], Macrobid [nitrofurantoin macrocrystal], and Ciprofloxacin  Family History   Problem Relation Age of Onset  . Heart disease Mother   . Hyperlipidemia Brother   . Heart attack Brother   . Heart disease Maternal Grandmother   . Breast cancer Maternal Grandmother   . Cancer - Lung Maternal Grandfather   . Brain cancer Paternal Grandmother   . Breast cancer Maternal Aunt     Social History   Socioeconomic History  . Marital status: Married    Spouse name: Not on file  . Number of children: Not on file  . Years of education: Not on file  . Highest education level: Not on file  Occupational History  . Not on file  Tobacco Use  . Smoking status: Never Smoker  . Smokeless tobacco: Never Used  Vaping Use  . Vaping Use: Never used  Substance and Sexual Activity  . Alcohol use: Yes    Comment: 2 glasses of wine/month  . Drug use: No  . Sexual activity: Yes    Birth control/protection: Other-see comments, Post-menopausal    Comment: BTL  Other Topics Concern  . Not on file  Social History Narrative  . Not on file   Social Determinants of Health  Financial Resource Strain: Not on file  Food Insecurity: Not on file  Transportation Needs: Not on file  Physical Activity: Not on file  Stress: Not on file  Social Connections: Not on file  Intimate Partner Violence: Not on file    Review of Systems  Constitutional: Negative.   HENT: Negative.   Eyes: Negative.   Respiratory: Negative.   Cardiovascular: Negative.   Gastrointestinal: Negative.   Genitourinary: Positive for frequency.       Painful urination Cloudy urine   Musculoskeletal: Negative.   Skin: Negative.   Neurological: Negative.   Endo/Heme/Allergies: Negative.   Psychiatric/Behavioral: Negative.     PHYSICAL EXAMINATION:    BP 116/72 (BP Location: Left Arm, Patient Position: Sitting, Cuff Size: Normal)   Pulse 76   Resp 16   Wt 164 lb (74.4 kg)   LMP 09/26/2009   BMI 27.29 kg/m     General appearance: alert, cooperative and appears stated age Abdomen: soft, mildly  tender in the suprapubic region; non distended, no masses,  no organomegaly CVA: not tender   Urine: >60 WBS, 3-10 RBC, 0-5 squam epi, moderate bacteria, negative nitrate  1. Painful urination  - Urinalysis,Complete w/RFL Culture  2. Cystitis Call with fever or flank pain. Call if not better in 48 hours.  - sulfamethoxazole-trimethoprim (BACTRIM DS) 800-160 MG tablet; Take 1 tablet by mouth 2 (two) times daily. One PO BID x 3 days  Dispense: 6 tablet; Refill: 0 - phenazopyridine (PYRIDIUM) 200 MG tablet; Take 1 tablet (200 mg total) by mouth 3 (three) times daily as needed for pain.  Dispense: 10 tablet; Refill: 0

## 2020-12-26 NOTE — Patient Instructions (Signed)
Urinary Tract Infection, Adult  A urinary tract infection (UTI) is an infection of any part of the urinary tract. The urinary tract includes the kidneys, ureters, bladder, and urethra. These organs make, store, and get rid of urine in the body. An upper UTI affects the ureters and kidneys. A lower UTI affects the bladder and urethra. What are the causes? Most urinary tract infections are caused by bacteria in your genital area around your urethra, where urine leaves your body. These bacteria grow and cause inflammation of your urinary tract. What increases the risk? You are more likely to develop this condition if:  You have a urinary catheter that stays in place.  You are not able to control when you urinate or have a bowel movement (incontinence).  You are female and you: ? Use a spermicide or diaphragm for birth control. ? Have low estrogen levels. ? Are pregnant.  You have certain genes that increase your risk.  You are sexually active.  You take antibiotic medicines.  You have a condition that causes your flow of urine to slow down, such as: ? An enlarged prostate, if you are female. ? Blockage in your urethra. ? A kidney stone. ? A nerve condition that affects your bladder control (neurogenic bladder). ? Not getting enough to drink, or not urinating often.  You have certain medical conditions, such as: ? Diabetes. ? A weak disease-fighting system (immunesystem). ? Sickle cell disease. ? Gout. ? Spinal cord injury. What are the signs or symptoms? Symptoms of this condition include:  Needing to urinate right away (urgency).  Frequent urination. This may include small amounts of urine each time you urinate.  Pain or burning with urination.  Blood in the urine.  Urine that smells bad or unusual.  Trouble urinating.  Cloudy urine.  Vaginal discharge, if you are female.  Pain in the abdomen or the lower back. You may also have:  Vomiting or a decreased  appetite.  Confusion.  Irritability or tiredness.  A fever or chills.  Diarrhea. The first symptom in older adults may be confusion. In some cases, they may not have any symptoms until the infection has worsened. How is this diagnosed? This condition is diagnosed based on your medical history and a physical exam. You may also have other tests, including:  Urine tests.  Blood tests.  Tests for STIs (sexually transmitted infections). If you have had more than one UTI, a cystoscopy or imaging studies may be done to determine the cause of the infections. How is this treated? Treatment for this condition includes:  Antibiotic medicine.  Over-the-counter medicines to treat discomfort.  Drinking enough water to stay hydrated. If you have frequent infections or have other conditions such as a kidney stone, you may need to see a health care provider who specializes in the urinary tract (urologist). In rare cases, urinary tract infections can cause sepsis. Sepsis is a life-threatening condition that occurs when the body responds to an infection. Sepsis is treated in the hospital with IV antibiotics, fluids, and other medicines. Follow these instructions at home: Medicines  Take over-the-counter and prescription medicines only as told by your health care provider.  If you were prescribed an antibiotic medicine, take it as told by your health care provider. Do not stop using the antibiotic even if you start to feel better. General instructions  Make sure you: ? Empty your bladder often and completely. Do not hold urine for long periods of time. ? Empty your bladder after   sex. ? Wipe from front to back after urinating or having a bowel movement if you are female. Use each tissue only one time when you wipe.  Drink enough fluid to keep your urine pale yellow.  Keep all follow-up visits. This is important.   Contact a health care provider if:  Your symptoms do not get better after 1-2  days.  Your symptoms go away and then return. Get help right away if:  You have severe pain in your back or your lower abdomen.  You have a fever or chills.  You have nausea or vomiting. Summary  A urinary tract infection (UTI) is an infection of any part of the urinary tract, which includes the kidneys, ureters, bladder, and urethra.  Most urinary tract infections are caused by bacteria in your genital area.  Treatment for this condition often includes antibiotic medicines.  If you were prescribed an antibiotic medicine, take it as told by your health care provider. Do not stop using the antibiotic even if you start to feel better.  Keep all follow-up visits. This is important. This information is not intended to replace advice given to you by your health care provider. Make sure you discuss any questions you have with your health care provider. Document Revised: 06/24/2020 Document Reviewed: 06/24/2020 Elsevier Patient Education  2021 [employer]vier Inc.  

## 2020-12-29 LAB — URINALYSIS, COMPLETE W/RFL CULTURE
Bilirubin Urine: NEGATIVE
Glucose, UA: NEGATIVE
Hyaline Cast: NONE SEEN /LPF
Ketones, ur: NEGATIVE
Nitrites, Initial: NEGATIVE
Protein, ur: NEGATIVE
Specific Gravity, Urine: 1.007 (ref 1.001–1.03)
WBC, UA: >=60 /HPF — AB (ref 0–5)
pH: 7 (ref 5.0–8.0)

## 2020-12-29 LAB — CULTURE INDICATED

## 2020-12-29 LAB — URINE CULTURE
MICRO NUMBER:: 11475568
SPECIMEN QUALITY:: ADEQUATE

## 2021-03-17 ENCOUNTER — Ambulatory Visit (INDEPENDENT_AMBULATORY_CARE_PROVIDER_SITE_OTHER): Payer: Medicare Other | Admitting: Obstetrics & Gynecology

## 2021-03-17 ENCOUNTER — Other Ambulatory Visit: Payer: Self-pay

## 2021-03-17 ENCOUNTER — Other Ambulatory Visit (HOSPITAL_COMMUNITY)
Admission: RE | Admit: 2021-03-17 | Discharge: 2021-03-17 | Disposition: A | Discharge status: Home / Self Care | Payer: Medicare Other | Source: Ambulatory Visit | Attending: Obstetrics & Gynecology | Admitting: Obstetrics & Gynecology

## 2021-03-17 ENCOUNTER — Encounter (HOSPITAL_BASED_OUTPATIENT_CLINIC_OR_DEPARTMENT_OTHER): Payer: Self-pay | Admitting: Obstetrics & Gynecology

## 2021-03-17 VITALS — BP 123/71 | HR 63 | Resp 18 | Ht 65.0 in | Wt 161.4 lb

## 2021-03-17 DIAGNOSIS — M810 Age-related osteoporosis without current pathological fracture: Secondary | ICD-10-CM | POA: Insufficient documentation

## 2021-03-17 DIAGNOSIS — D849 Immunodeficiency, unspecified: Secondary | ICD-10-CM

## 2021-03-17 DIAGNOSIS — M8580 Other specified disorders of bone density and structure, unspecified site: Secondary | ICD-10-CM

## 2021-03-17 DIAGNOSIS — Z124 Encounter for screening for malignant neoplasm of cervix: Secondary | ICD-10-CM

## 2021-03-17 DIAGNOSIS — M05731 Rheumatoid arthritis with rheumatoid factor of right wrist without organ or systems involvement: Secondary | ICD-10-CM

## 2021-03-17 DIAGNOSIS — Z1231 Encounter for screening mammogram for malignant neoplasm of breast: Secondary | ICD-10-CM | POA: Diagnosis not present

## 2021-03-17 DIAGNOSIS — Z1151 Encounter for screening for human papillomavirus (HPV): Secondary | ICD-10-CM | POA: Insufficient documentation

## 2021-03-17 DIAGNOSIS — Z01419 Encounter for gynecological examination (general) (routine) without abnormal findings: Secondary | ICD-10-CM

## 2021-03-17 DIAGNOSIS — M05732 Rheumatoid arthritis with rheumatoid factor of left wrist without organ or systems involvement: Secondary | ICD-10-CM

## 2021-03-17 NOTE — Patient Instructions (Signed)
Needs Prevner 13 and then pneumovax 6-12 months later.

## 2021-03-17 NOTE — Progress Notes (Signed)
65 y.o. G13P0011 [name] here for annual exam.  Does not have PCP.  Encouraged to work on establishing care.  H/o RA.  On Mtx and Humira.    Denies vaginal bleeding.  Lost weight last year and has kept almost all of it off. Congratulated her regarding this.  [name] last menstrual period was 09/26/2009.          Sexually active: Yes.    The current method of family planning is post menopausal status.    Exercising: Yes.    walking Smoker:  no  Health Maintenance: Pap:  2019 History of abnormal Pap:  no MMG:  2020, aware due Colonoscopy:  cologuard 2020.  Has declined colonoscopy. BMD:   2018 TDaP:  due Pneumonia vaccine(s):  Recommended starting pneumovax series Hep C testing: 2019 Screening Labs: 2021   reports that she has never smoked. She has never used smokeless tobacco. She reports current alcohol use. She reports that she does not use drugs.  Past Medical History:  Diagnosis Date  . 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 Osteoporosis   . 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 Gemma Payor in [place]  . Shingles 02/2020   face    Past Surgical History:  Procedure Laterality Date  . CERVICAL POLYPECTOMY  2003  . TONSILLECTOMY  1958  . TUBAL LIGATION     BTL    Current Outpatient Medications  Medication Sig Dispense Refill  . Calcium Citrate-Vitamin D (CALCIUM + D PO) Take by mouth daily.    . folic acid (FOLVITE) 170 MCG tablet Take 800 mcg by mouth daily.    Marland Kitchen HUMIRA PEN 40 MG/0.4ML PNKT SMARTSIG:40 Milligram(s) SUB-Q Every 2 Weeks    . methotrexate 2.5 MG tablet Take 10 mg by mouth once a week.     No current facility-administered medications for this visit.    Family History  Problem Relation Age of Onset  . Heart disease Mother   . Hyperlipidemia Brother   . Heart attack Brother   . Heart disease Maternal Grandmother   . Breast cancer Maternal Grandmother   . Cancer - Lung Maternal Grandfather   . Brain cancer Paternal Grandmother   . Breast  cancer Maternal Aunt     Review of Systems  All other systems reviewed and are negative.   Exam:   BP 123/71   Pulse 63   Resp 18   Ht 5\' 5"  (1.651 m)   Wt 161 lb 6.4 oz (73.2 kg)   LMP 09/26/2009   BMI 26.86 kg/m   Height: 5\' 5"  (165.1 cm)  General appearance: alert, cooperative and appears stated age Head: Normocephalic, without obvious abnormality, atraumatic Neck: no adenopathy, supple, symmetrical, trachea midline and thyroid normal to inspection and palpation Lungs: clear to auscultation bilaterally Breasts: normal appearance, no masses or tenderness Heart: regular rate and rhythm Abdomen: soft, non-tender; bowel sounds normal; no masses,  no organomegaly Extremities: extremities normal, atraumatic, no cyanosis or edema Skin: Skin color, texture, turgor normal. No rashes or lesions Lymph nodes: Cervical, supraclavicular, and axillary nodes normal. No abnormal inguinal nodes palpated Neurologic: Grossly normal   Pelvic: External genitalia:  no lesions              Urethra:  normal appearing urethra with no masses, tenderness or lesions              Bartholins and Skenes: normal  Vagina: normal appearing vagina with normal color and no discharge, no lesions              Cervix: no lesions              Pap taken: Yes.   Bimanual Exam:  Uterus:  normal size, contour, position, consistency, mobility, non-tender              Adnexa: normal adnexa and no mass, fullness, tenderness               Rectovaginal: Confirms               Anus:  normal sphincter tone, no lesions  Chaperone, Prince Rome, CMA, was present for exam.  Assessment/Plan: 1. Well woman exam with routine gynecological exam - pap smear obtained today - order for BMD placed.  Pt aware this is due. - cologuard 2020.  Declines colonoscopy - BMD order placed - vaccines updated.  Pt knows needs tdap and pneumovax - lab work done 2021 - pt does need to establish care with PCP  2.  Osteopenia, unspecified location  3. 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 Immunosuppression ([place])

## 2021-03-20 LAB — CYTOLOGY - PAP
Comment: NEGATIVE
Diagnosis: NEGATIVE
High risk HPV: NEGATIVE

## 2021-03-27 ENCOUNTER — Ambulatory Visit (HOSPITAL_BASED_OUTPATIENT_CLINIC_OR_DEPARTMENT_OTHER): Payer: Medicare Other | Admitting: Nurse Practitioner

## 2021-04-12 ENCOUNTER — Other Ambulatory Visit (HOSPITAL_BASED_OUTPATIENT_CLINIC_OR_DEPARTMENT_OTHER): Payer: Self-pay | Admitting: Obstetrics & Gynecology

## 2021-04-12 DIAGNOSIS — M8580 Other specified disorders of bone density and structure, unspecified site: Secondary | ICD-10-CM

## 2021-04-12 DIAGNOSIS — Z1231 Encounter for screening mammogram for malignant neoplasm of breast: Secondary | ICD-10-CM

## 2021-04-27 ENCOUNTER — Ambulatory Visit (INDEPENDENT_AMBULATORY_CARE_PROVIDER_SITE_OTHER): Payer: Medicare Other | Admitting: Nurse Practitioner

## 2021-04-27 ENCOUNTER — Encounter (HOSPITAL_BASED_OUTPATIENT_CLINIC_OR_DEPARTMENT_OTHER): Payer: Self-pay | Admitting: Nurse Practitioner

## 2021-04-27 ENCOUNTER — Other Ambulatory Visit (HOSPITAL_BASED_OUTPATIENT_CLINIC_OR_DEPARTMENT_OTHER)
Admission: RE | Admit: 2021-04-27 | Discharge: 2021-04-27 | Disposition: A | Discharge status: Home / Self Care | Payer: Medicare Other | Source: Ambulatory Visit | Attending: Obstetrics & Gynecology | Admitting: Obstetrics & Gynecology

## 2021-04-27 ENCOUNTER — Ambulatory Visit (HOSPITAL_BASED_OUTPATIENT_CLINIC_OR_DEPARTMENT_OTHER)
Admission: RE | Admit: 2021-04-27 | Discharge: 2021-04-27 | Disposition: A | Discharge status: Home / Self Care | Payer: Medicare Other | Source: Ambulatory Visit | Attending: Obstetrics & Gynecology | Admitting: Obstetrics & Gynecology

## 2021-04-27 ENCOUNTER — Other Ambulatory Visit: Payer: Self-pay

## 2021-04-27 VITALS — BP 117/63 | HR 65 | Ht 65.0 in | Wt 163.6 lb

## 2021-04-27 DIAGNOSIS — M8580 Other specified disorders of bone density and structure, unspecified site: Secondary | ICD-10-CM | POA: Diagnosis present

## 2021-04-27 DIAGNOSIS — M05732 Rheumatoid arthritis with rheumatoid factor of left wrist without organ or systems involvement: Secondary | ICD-10-CM

## 2021-04-27 DIAGNOSIS — Z130 Encounter for screening for diseases of the blood and blood-forming organs and certain disorders involving the immune mechanism: Secondary | ICD-10-CM | POA: Diagnosis not present

## 2021-04-27 DIAGNOSIS — Z1231 Encounter for screening mammogram for malignant neoplasm of breast: Secondary | ICD-10-CM | POA: Insufficient documentation

## 2021-04-27 DIAGNOSIS — M05731 Rheumatoid arthritis with rheumatoid factor of right wrist without organ or systems involvement: Secondary | ICD-10-CM

## 2021-04-27 DIAGNOSIS — Z780 Asymptomatic menopausal state: Secondary | ICD-10-CM | POA: Diagnosis not present

## 2021-04-27 DIAGNOSIS — Z862 Personal history of diseases of the blood and blood-forming organs and certain disorders involving the immune mechanism: Secondary | ICD-10-CM

## 2021-04-27 DIAGNOSIS — Z1329 Encounter for screening for other suspected endocrine disorder: Secondary | ICD-10-CM | POA: Insufficient documentation

## 2021-04-27 DIAGNOSIS — Z0000 Encounter for general adult medical examination without abnormal findings: Secondary | ICD-10-CM

## 2021-04-27 DIAGNOSIS — Z23 Encounter for immunization: Secondary | ICD-10-CM | POA: Diagnosis not present

## 2021-04-27 DIAGNOSIS — Z7689 Persons encountering health services in other specified circumstances: Secondary | ICD-10-CM | POA: Diagnosis not present

## 2021-04-27 DIAGNOSIS — Z13228 Encounter for screening for other metabolic disorders: Secondary | ICD-10-CM

## 2021-04-27 DIAGNOSIS — Z6827 Body mass index (BMI) 27.0-27.9, adult: Secondary | ICD-10-CM

## 2021-04-27 DIAGNOSIS — M810 Age-related osteoporosis without current pathological fracture: Secondary | ICD-10-CM

## 2021-04-27 DIAGNOSIS — Z6830 Body mass index (BMI) 30.0-30.9, adult: Secondary | ICD-10-CM | POA: Insufficient documentation

## 2021-04-27 DIAGNOSIS — Z1321 Encounter for screening for nutritional disorder: Secondary | ICD-10-CM

## 2021-04-27 DIAGNOSIS — Z131 Encounter for screening for diabetes mellitus: Secondary | ICD-10-CM | POA: Insufficient documentation

## 2021-04-27 DIAGNOSIS — Z13820 Encounter for screening for osteoporosis: Secondary | ICD-10-CM | POA: Insufficient documentation

## 2021-04-27 DIAGNOSIS — T63481A Toxic effect of venom of other arthropod, accidental (unintentional), initial encounter: Secondary | ICD-10-CM | POA: Insufficient documentation

## 2021-04-27 HISTORY — DX: Persons encountering health services in other specified circumstances: Z76.89

## 2021-04-27 LAB — COMPREHENSIVE METABOLIC PANEL
ALT: 14 U/L (ref 0–44)
AST: 17 U/L (ref 15–41)
Albumin: 4.4 g/dL (ref 3.5–5.0)
Alkaline Phosphatase: 49 U/L (ref 38–126)
Anion gap: 8 (ref 5–15)
BUN: 18 mg/dL (ref 8–23)
CO2: 29 mmol/L (ref 22–32)
Calcium: 9.1 mg/dL (ref 8.9–10.3)
Chloride: 104 mmol/L (ref 98–111)
Creatinine, Ser: 0.63 mg/dL (ref 0.44–1.00)
GFR, Estimated: >60 mL/min (ref >60)
Glucose, Bld: 92 mg/dL (ref 70–99)
Potassium: 4.3 mmol/L (ref 3.5–5.1)
Sodium: 141 mmol/L (ref 135–145)
Total Bilirubin: 0.6 mg/dL (ref 0.3–1.2)
Total Protein: 7.2 g/dL (ref 6.5–8.1)

## 2021-04-27 LAB — CBC WITH DIFFERENTIAL/PLATELET
Abs Immature Granulocytes: 0.01 10*3/uL (ref 0.00–0.07)
Basophils Absolute: 0 10*3/uL (ref 0.0–0.1)
Basophils Relative: 1 %
Eosinophils Absolute: 0.4 10*3/uL (ref 0.0–0.5)
Eosinophils Relative: 6 %
HCT: 38 % (ref 36.0–46.0)
Hemoglobin: 12.7 g/dL (ref 12.0–15.0)
Immature Granulocytes: 0 %
Lymphocytes Relative: 37 %
Lymphs Abs: 2.4 10*3/uL (ref 0.7–4.0)
MCH: 29.4 pg (ref 26.0–34.0)
MCHC: 33.4 g/dL (ref 30.0–36.0)
MCV: 88 fL (ref 80.0–100.0)
Monocytes Absolute: 0.4 10*3/uL (ref 0.1–1.0)
Monocytes Relative: 6 %
Neutro Abs: 3.4 10*3/uL (ref 1.7–7.7)
Neutrophils Relative %: 50 %
Platelets: 293 10*3/uL (ref 150–400)
RBC: 4.32 MIL/uL (ref 3.87–5.11)
RDW: 12.8 % (ref 11.5–15.5)
WBC: 6.6 10*3/uL (ref 4.0–10.5)
nRBC: 0 % (ref 0.0–0.2)

## 2021-04-27 LAB — LIPID PANEL
Cholesterol: 193 mg/dL (ref 0–200)
HDL: 66 mg/dL (ref >40)
LDL Cholesterol: 116 mg/dL — ABNORMAL HIGH (ref 0–99)
Total CHOL/HDL Ratio: 2.9 RATIO
Triglycerides: 56 mg/dL (ref <150)
VLDL: 11 mg/dL (ref 0–40)

## 2021-04-27 LAB — TSH: TSH: 2.836 u[IU]/mL (ref 0.350–4.500)

## 2021-04-27 MED ORDER — ZOSTER VAC RECOMB ADJUVANTED 50 MCG/0.5ML IM SUSR: 0.5000 mL | Freq: Once | INTRAMUSCULAR | 1 refills | Status: AC

## 2021-04-27 MED ORDER — EPINEPHRINE 0.3 MG/0.3ML IJ SOAJ
0.3000 mg | Freq: Once | INTRAMUSCULAR | 1 refills | Status: AC
Start: 2021-04-27 — End: 2021-04-27

## 2021-04-27 NOTE — Progress Notes (Signed)
Worthy Keeler, DNP, AGNP-c Primary Care Services ______________________________________________________________________________________________________________________________________________  HPI Jill Lara is a 65 y.o. female presenting to [facility] today to establish care.   Patient Care Team: Whitnee Orzel, Coralee Pesa, NP as PCP - General (Nurse Practitioner) Last CPE: none Other providers seen:Dr. Hale Bogus OBGYN, Dr. Marijean Bravo with Rheumatology  Concerns today: . Labs o Endorses methotrexate and humira for RA  o Low platelet counts and other abnormalities earlier this year requiring her to stop therapy for about 2 weeks.  o Therapy restarted since that time, but labs not monitored o No s/s present today, but would like routine monitoring to ensure labs are normalized . Anaphylaxis to [place] o In need of EpiPen refill o No use in recent year o No s/s present today . RA o Would like to know whether she should take the second COVID booster o Stops RA medication a week before and after vaccine o No concerns in the past with doing this . Hx shingles in 2021- no vaccine . Tdap booster in 2013/2014 . Cologuard 2020 . Mammogram- today . DEXA- today . Pap 03/17/21 . Does not take the flu vaccine . Has not had pneumonia vaccines   Narrative: Jill Lara is married with one adult daughter and a grandson who live about an hour from her.  She reports she is safe in his current relationships and home environment. She does not have a history or partner abuse.  She is currently retired. Worked for [employer].  She endorses walking every day and eating a healthy diet. [place]  She denies nicotine use and recreational drug use. She endorses occasional alcohol intake with no concerns for overuse.   She is currently sexually active with one parter, her  husband. She denies concerns for STI today.  She is post-menopausal and denies current concerns. She is followed by Dr. Sabra Heck for her gynecological health.   She denies recent changes to bowel habits, denies recent changes to bladder habits, denies recent changes to skin.  She denies recent mood related changes. PHQ and GAD listed below.  PHQ9 Today: Depression screen [facility] 2/9 04/27/2021 03/17/2021  Decreased Interest 0 0  Down, Depressed, Hopeless 0 0  PHQ - 2 Score 0 0  Altered sleeping 0 -  Tired, decreased energy 0 -  Change in appetite 0 -  Feeling bad or failure about yourself  0 -  Trouble concentrating 0 -  Moving slowly or fidgety/restless 0 -  Suicidal thoughts 0 -  PHQ-9 Score 0 -   GAD7 Today: GAD 7 : Generalized Anxiety Score 04/27/2021  Nervous, Anxious, on Edge 0  Control/stop worrying 0  Worry too much - different things 0  Trouble relaxing 0  Restless 0  Easily annoyed or irritable 0  Afraid - awful might happen 0  Total GAD 7 Score 0    Health Maintenance Due  Topic Date Due  . Zoster Vaccines- Shingrix (1 of 2) Never done  . TETANUS/TDAP  10/31/2016  . MAMMOGRAM  01/22/2021     PMH Past Medical History:  Diagnosis Date  . 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 Osteoporosis   . 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 Gemma Payor in [place]  . Shingles 02/2020   face    ROS All review of systems negative except what is listed in the HPI  PHYSICAL EXAM General Appearance:  awake, alert, oriented, in no acute distress and well developed, well nourished Skin:  skin color, texture, turgor are normal; there are no bruises, rashes or lesions. Head/face:  NCAT Eyes:  No gross abnormalities., PERRL, EOMI and Sclera nonicteric Ears:  canals and TMs NI Nose/Sinuses:  Nares normal. Septum midline. Mucosa normal. No drainage or sinus tenderness. Mouth/Throat:  Mucosa moist, no lesions; pharynx without erythema, edema or exudate. Neck:  neck- supple, no mass, non-tender, no bruits  and no jvd Back:  no pain to palpation, good flexion and extension, motor and sensory appear to be normal Lungs:  Normal expansion.  Clear to auscultation.  No rales, rhonchi, or wheezing., No chest wall tenderness., No kyphosis or scoliosis. Heart:  Heart sounds are normal.  Regular rate and rhythm without murmur, gallop or rub. Abdomen:  Soft, non-tender, normal bowel sounds; no bruits, organomegaly or masses. Extremities: Extremities warm to touch, pink, with no edema., no edema and no calf pain Musculoskeletal:  Range of motion normal in hips, knees, shoulders, and spine. Peripheral Pulses:  Capillary refill <2secs, strong peripheral pulses Neurologic:  Alert and oriented x 3, gait normal., reflexes normal and symmetric, strength and  sensation grossly normal Psych exam:alert,oriented, in NAD with a full range of affect, normal behavior and no psychotic features  ASSESSMENT AND PLAN Problem List Items Addressed This Visit    Encounter to establish care - Primary   Relevant Medications   EPINEPHrine 0.3 mg/0.3 mL IJ SOAJ injection   Zoster Vaccine Adjuvanted [facility]) injection   Other Relevant Orders   CBC w/Diff/Platelet (Completed)   Comprehensive metabolic panel (Completed)   Lipid panel   TSH   Hemoglobin A1c   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   Relevant Medications   EPINEPHrine 0.3 mg/0.3 mL IJ SOAJ injection   History of decreased platelet count   Relevant Orders   CBC w/Diff/Platelet (Completed)   Rheumatoid arthritis (HCC)   Relevant Orders   CBC w/Diff/Platelet (Completed)   Comprehensive metabolic panel (Completed)   Lipid panel   TSH   Osteoporosis without current pathological fracture   Relevant Orders   Comprehensive metabolic panel (Completed)    Other Visit Diagnoses    Screening for endocrine, nutritional, metabolic and immunity disorder       Relevant Orders   CBC w/Diff/Platelet (Completed)   Comprehensive metabolic panel (Completed)   Lipid panel    TSH   Hemoglobin A1c   Screening for diabetes mellitus       Relevant Orders   Comprehensive metabolic panel (Completed)   Hemoglobin A1c   Screening for deficiency anemia       Relevant Orders   CBC w/Diff/Platelet (Completed)   Body mass index 27.0-27.9, adult          Education provided today during visit and on AVS for patient to review at home.  Diet and Exercise recommendations provided.  Current diagnoses and recommendations discussed. HM recommendations reviewed with recommendations.    Outpatient Encounter Medications as of 04/27/2021  Medication Sig  . Calcium Citrate-Vitamin D (CALCIUM + D PO) Take by mouth daily.  Marland Kitchen EPINEPHrine 0.3 mg/0.3 mL IJ SOAJ injection Inject 0.3 mg into the muscle once for 1 dose.  . folic acid (FOLVITE) 211 MCG tablet Take 800 mcg by mouth daily.  Marland Kitchen HUMIRA PEN 40 MG/0.4ML PNKT SMARTSIG:40 Milligram(s) SUB-Q Every 2 Weeks  . methotrexate 2.5 MG tablet Take 10 mg by mouth once a week.  . Zoster Vaccine Adjuvanted [facility]) injection Inject 0.5  mLs into the muscle once for 1 dose. Repeat in 2-6 months. Please fax confirmation of vaccination to Jacolyn Reedy, DNP at [phone]   No facility-administered encounter medications on file as of 04/27/2021.    Return in about 6 months (around 10/27/2021) for CPE and labs today- Labs in 6 months.  Time: 65 minutes, >50% spent counseling, care coordination, chart review, and documentation.   Orma Render, DNP, AGNP-c

## 2021-04-27 NOTE — Assessment & Plan Note (Signed)
DEXA today Will follow and make changes to plan of care as necessary

## 2021-04-27 NOTE — Assessment & Plan Note (Signed)
Labs today Thought to be due to medication for RA Will make changes to the plan of care as necessary based on lab results.

## 2021-04-27 NOTE — Assessment & Plan Note (Signed)
No concerns today Refill provided on EpiPen

## 2021-04-27 NOTE — Patient Instructions (Addendum)
Recommendations from today's visit: . We will get labs today and I will let you know if we need to make any changes based on results.   Information on diet, exercise, and health maintenance recommendations are listed below. This is information to help you be sure you are on track for optimal health and monitoring.   Please look over this and let us know if you have any questions or if you have completed any of the health maintenance outside of [place] so that we can be sure your records are up to date.  ___________________________________________________________  Thank you for choosing [place] at [facility] for your Primary Care needs. I am excited for the opportunity to partner with you to meet your health care goals. It was a pleasure meeting you today!  I am an Adult-Geriatric Nurse Practitioner with a background in caring for patients for more than 20 years. I received my Paediatric nurse in Nursing and my Doctor of Nursing Practice degrees at [employer]. I received additional fellowship training in primary care and sports medicine after receiving my doctorate degree. I provide primary care and sports medicine services to patients age 37 and older within this office. I am also a provider with the [facility] and the director of the APP Fellowship with [facility].  I am a [place] native, but have called the [place] area home for nearly 20 years and am proud to be a member of this community.   I am passionate about providing the best service to you through preventive medicine and supportive care. I consider you a part of the medical team and value your input. I work diligently to ensure that you are heard and your needs are met in a safe and effective manner. I want you to feel comfortable with me as your provider and want you to know that your health concerns are important to me.   For your information, our office hours  are Monday- Friday 8:00 AM - 5:00 PM At this time I am not in the office on Wednesdays.  If you have questions or concerns, please call our office at [phone] or send [place] a MyChart message and we will respond as quickly as possible.   For all urgent or time sensitive needs we ask that you please call the office to avoid delays. MyChart is not constantly monitored and replies may take up to 72 business hours.  MyChart Policy: . MyChart allows for you to see your visit notes, after visit summary, provider recommendations, lab and tests results, make an appointment, request refills, and contact your provider or the office for non-urgent questions or concerns.  . Providers are seeing patients during normal business hours and do not have built in time to review MyChart messages. We ask that you allow a minimum of 72 business hours for MyChart message responses.  . Complex MyChart concerns may require a visit. Your provider may request you schedule a virtual or in person visit to ensure we are providing the best care possible. . MyChart messages sent after 4:00 PM on Friday will not be received by the provider until Monday morning.    Lab and Test Results: . You will receive your lab and test results on MyChart as soon as they are completed and results have been sent by the lab or testing facility. Due to this service, you will receive your results BEFORE your provider.  . Please allow a minimum  of 72 business hours for your provider to receive and review lab and test results and contact you about.   . Most lab and test result comments from the provider will be sent through [place]. Your provider may recommend changes to the plan of care, follow-up visits, repeat testing, ask questions, or request an office visit to discuss these results. You may reply directly to this message or call the office at [phone] to provide information for the provider or set up an appointment. . In some instances, you  will be called with test results and recommendations. Please let us know if this is preferred and we will make note of this in your chart to provide this for you.    . If you have not heard a response to your lab or test results in 72 business hours, please call the office to let us know.   After Hours: . For all non-emergency after hours needs, please call the office at [phone] and select the option to reach the on-call provider service. On-call services are shared between multiple [facility] offices and therefore it will not be possible to speak directly with your provider. On-call providers may provide medical advice and recommendations, but are unable to provide refills for maintenance medications.  . For all emergency or urgent medical needs after normal business hours, we recommend that you seek care at the closest Urgent Care or Emergency Department to ensure appropriate treatment in a timely manner.  [facility] [place] [facility] has a 24 hour emergency room located on the ground floor for your convenience.    Please do not hesitate to reach out to [place] with concerns.   Thank you, again, for choosing me as your health care partner. I appreciate your trust and look forward to learning more about you.   Worthy Keeler, DNP, AGNP-c ___________________________________________________________  Health Maintenance Recommendations Screening Testing  Mammogram  Every 1 -2 years based on history and risk factors  Starting at age 20  Pap Smear  Ages 21-39 every 3 years  Ages 11-65 every 5 years with HPV testing  More frequent testing may be required based on results and history  Colon Cancer Screening  Every 1-10 years based on test performed, risk factors, and history  Starting at age 66  Bone Density Screening  Every 2-10 years based on history  Starting at age 71 for women  Recommendations for men differ based on medication usage, history, and risk  factors  AAA Screening  One time ultrasound  Men 48-74 years old who have every smoked  Lung Cancer Screening  Low Dose Lung CT every 12 months  Age 35-80 years with a 30 pack-year smoking history who still smoke or who have quit within the last 15 years  Screening Labs  Routine  Labs: Complete Blood Count (CBC), Complete Metabolic Panel (CMP), Cholesterol (Lipid Panel)  Every 6-12 months based on history and medications  May be recommended more frequently based on current conditions or previous results  Hemoglobin A1c Lab  Every 3-12 months based on history and previous results  Starting at age 73 or earlier with diagnosis of diabetes, high cholesterol, BMI >26, and/or risk factors  Frequent monitoring for patients with diabetes to ensure blood sugar control  Thyroid Panel (TSH w/ T3 & T4)  Every 6 months based on history, symptoms, and risk factors  May be repeated more often if on medication  HIV  One time testing for all patients 39 and older  May be repeated more frequently for patients with increased risk factors or exposure  Hepatitis C  One time testing for all patients 82 and older  May be repeated more frequently for patients with increased risk factors or exposure  Gonorrhea, Chlamydia  Every 12 months for all sexually active persons 13-24 years  Additional monitoring may be recommended for those who are considered high risk or who have symptoms  PSA  Men 79-69 years old with risk factors  Additional screening may be recommended from age 84-69 based on risk factors, symptoms, and history  Vaccine Recommendations  Tetanus Booster  All adults every 10 years  Flu Vaccine  All patients 6 months and older every year  COVID Vaccine  All patients 12 years and older  Initial dosing with booster  May recommend additional booster based on age and health history  HPV Vaccine  2 doses all patients age 44-26  Dosing may be considered for  patients over 26  Shingles Vaccine (Shingrix)  2 doses all adults 50 years and older  Pneumonia (Pneumovax 23)  All adults 35 years and older  May recommend earlier dosing based on health history  Pneumonia (Prevnar 39)  All adults 50 years and older  Dosed 1 year after Pneumovax 23  Additional Screening, Testing, and Vaccinations may be recommended on an individualized basis based on family history, health history, risk factors, and/or exposure.  __________________________________________________________  Diet Recommendations for All Patients  I recommend that all patients maintain a diet low in saturated fats, carbohydrates, and cholesterol. While this can be challenging at first, it is not impossible and small changes can make big differences.  Things to try: [place] [employer] Decreasing the amount of soda, sweet tea, and/or juice to one or less per day and replace with water o While water is always the first choice, if you do not like water you may consider - adding a water additive without sugar to improve the taste - other sugar free drinks . Replace potatoes with a brightly colored vegetable at dinner . Use healthy oils, such as canola oil or olive oil, instead of butter or hard margarine . Limit your bread intake to two pieces or less a day . Replace regular pasta with low carb pasta options . Bake, broil, or grill foods instead of frying . Monitor portion sizes  . Eat smaller, more frequent meals throughout the day instead of large meals  An important thing to remember is, if you love foods that are not great for your health, you don't have to give them up completely. Instead, allow these foods to be a reward when you have done well. Allowing yourself to still have special treats every once in a while is a nice way to tell yourself thank you for working hard to keep yourself healthy.   Also remember that every day is a new day. If you have a bad day and "fall off the wagon", you can  still climb right back up and keep moving along on your journey!  We have resources available to help you!  Some websites that may be helpful include: . www.http://carter.biz/  . Www.VeryWellFit.com _____________________________________________________________  Activity Recommendations for All Patients  I recommend that all adults get at least 20 minutes of moderate physical activity that elevates your heart rate at least 5 days out of the week.  Some examples include: . Walking or jogging at a pace that allows you to carry on a conversation . Cycling (stationary bike or outdoors) .  Water aerobics . Yoga . Weight lifting . Dancing If physical limitations prevent you from putting stress on your joints, exercise in a pool or seated in a chair are excellent options.  Do determine your MAXIMUM heart rate for activity: YOUR AGE - 220 = MAX HeartRate   Remember! . Do not push yourself too hard.  . Start slowly and build up your pace, speed, weight, time in exercise, etc.  . Allow your body to rest between exercise and get good sleep. . You will need more water than normal when you are exerting yourself. Do not wait until you are thirsty to drink. Drink with a purpose of getting in at least 8, 8 ounce glasses of water a day plus more depending on how much you exercise and sweat.    If you begin to develop dizziness, chest pain, abdominal pain, jaw pain, shortness of breath, headache, vision changes, lightheadedness, or other concerning symptoms, stop the activity and allow your body to rest. If your symptoms are severe, seek emergency evaluation immediately. If your symptoms are concerning, but not severe, please let us know so that we can recommend further evaluation.   ________________________________________________________________

## 2021-04-27 NOTE — Assessment & Plan Note (Signed)
BMI 27.22 Recommendations for diet and exercise provided- no concerns with obesity or overweight

## 2021-04-27 NOTE — Assessment & Plan Note (Signed)
CPE today with no concerning findings. Will obtain labs Mammogram and DEXA today Recommend COVID booster (4th shot) Recommend Shingles vaccine- will need to hold medications, wait at least 2 weeks after COVID booster- written script provided Recommend waiting until next year for PNA vaccines Cologuard in 2023

## 2021-04-27 NOTE — Assessment & Plan Note (Signed)
Doing well on current treatment with Humira and Methotrexate Will get repeat labs today to ensure no evidence of abnormality Recommend 4th COVID-19 vaccine booster with holding medication as previously recommended by Rheumatology in near future  Will hold off of PNA vaccine at this time

## 2021-04-27 NOTE — Assessment & Plan Note (Signed)
>>  ASSESSMENT AND PLAN FOR OSTEOPOROSIS WITHOUT CURRENT PATHOLOGICAL FRACTURE WRITTEN ON 04/27/2021  1:10 PM BY ,  E, NP  DEXA today Will follow and make changes to plan of care as necessary

## 2021-04-27 NOTE — Assessment & Plan Note (Signed)
Review of current and past medical history, social history, medication, and family history.  Review of care gaps and health maintenance recommendations.  Records from recent providers to be requested if not available in Chart Review or Care Everywhere.  Recommendations for health maintenance, diet, and exercise provided.   

## 2021-04-27 NOTE — Assessment & Plan Note (Signed)
>>  ASSESSMENT AND PLAN FOR BODY MASS INDEX 27.0-27.9, ADULT WRITTEN ON 04/27/2021  1:12 PM BY Aldonia Keeven E, NP  BMI 27.22 Recommendations for diet and exercise provided- no concerns with obesity or overweight

## 2021-04-28 ENCOUNTER — Telehealth (HOSPITAL_BASED_OUTPATIENT_CLINIC_OR_DEPARTMENT_OTHER): Payer: Self-pay

## 2021-04-28 ENCOUNTER — Encounter (HOSPITAL_BASED_OUTPATIENT_CLINIC_OR_DEPARTMENT_OTHER): Payer: Self-pay

## 2021-04-28 LAB — HEMOGLOBIN A1C
Hgb A1c MFr Bld: 5.5 % (ref 4.8–5.6)
Mean Plasma Glucose: 111 mg/dL

## 2021-04-28 NOTE — Telephone Encounter (Signed)
Results released by Melina [facility] and MyChart message sent to patient re: lab results and recommendations.  Instructed patient to contact the office with any questions or concerns.

## 2021-04-28 NOTE — Telephone Encounter (Signed)
-----   Message from Orma Render, NP sent at 04/28/2021  8:37 AM EDT ----- Please sent patient note:  Labs look good.  Slight elevation in LDL (bad) cholesterol, but HDL (good) cholesterol high so no concerns. Recommendations to decrease saturated fats in diet and include high fiber foods to help lower cholesterol and continue to exercise.  Will plan to recheck in one year or sooner, if problems present.

## 2021-04-28 NOTE — Progress Notes (Signed)
Please sent patient note:  Labs look good.  Slight elevation in LDL (bad) cholesterol, but HDL (good) cholesterol high so no concerns. Recommendations to decrease saturated fats in diet and include high fiber foods to help lower cholesterol and continue to exercise.  Will plan to recheck in one year or sooner, if problems present.

## 2021-05-24 DIAGNOSIS — M150 Primary generalized (osteo)arthritis: Secondary | ICD-10-CM | POA: Diagnosis not present

## 2021-05-24 DIAGNOSIS — M25561 Pain in right knee: Secondary | ICD-10-CM | POA: Diagnosis not present

## 2021-05-24 DIAGNOSIS — M810 Age-related osteoporosis without current pathological fracture: Secondary | ICD-10-CM | POA: Diagnosis not present

## 2021-05-24 DIAGNOSIS — M0579 Rheumatoid arthritis with rheumatoid factor of multiple sites without organ or systems involvement: Secondary | ICD-10-CM | POA: Diagnosis not present

## 2021-05-24 DIAGNOSIS — M7989 Other specified soft tissue disorders: Secondary | ICD-10-CM | POA: Diagnosis not present

## 2021-08-25 ENCOUNTER — Other Ambulatory Visit (HOSPITAL_BASED_OUTPATIENT_CLINIC_OR_DEPARTMENT_OTHER): Payer: Self-pay | Admitting: Nurse Practitioner

## 2021-08-31 DIAGNOSIS — M0579 Rheumatoid arthritis with rheumatoid factor of multiple sites without organ or systems involvement: Secondary | ICD-10-CM | POA: Diagnosis not present

## 2022-04-13 DIAGNOSIS — M0579 Rheumatoid arthritis with rheumatoid factor of multiple sites without organ or systems involvement: Secondary | ICD-10-CM | POA: Diagnosis not present

## 2022-05-01 ENCOUNTER — Encounter (HOSPITAL_BASED_OUTPATIENT_CLINIC_OR_DEPARTMENT_OTHER): Payer: Self-pay | Admitting: Nurse Practitioner

## 2022-05-01 ENCOUNTER — Ambulatory Visit (INDEPENDENT_AMBULATORY_CARE_PROVIDER_SITE_OTHER): Payer: Medicare Other | Admitting: Nurse Practitioner

## 2022-05-01 VITALS — BP 112/76 | HR 62 | Ht 65.0 in | Wt 175.4 lb

## 2022-05-01 DIAGNOSIS — Z23 Encounter for immunization: Secondary | ICD-10-CM | POA: Diagnosis not present

## 2022-05-01 DIAGNOSIS — Z1211 Encounter for screening for malignant neoplasm of colon: Secondary | ICD-10-CM

## 2022-05-01 MED ORDER — ZOSTER VAC RECOMB ADJUVANTED 50 MCG/0.5ML IM SUSR: 0.5000 mL | Freq: Once | INTRAMUSCULAR | 1 refills | Status: AC

## 2022-05-01 NOTE — Progress Notes (Signed)
BP 112/76   Pulse 62   Ht '5\' 5"'$  (1.651 m)   Wt 175 lb 6.4 oz (79.6 kg)   LMP 09/26/2009   BMI 29.19 kg/m    Subjective:    Patient ID: Jill Lara, female    DOB: [birth year], 66 y.o.   MRN: [id number]  HPI: Jill Lara is a 66 y.o. female presenting on 05/01/2022 for comprehensive medical examination.   Current medical concerns include:none  She reports regular vision exams q1-5y: yes She reports regular dental exams q 59m yes Her diet consists of:  Healthy options She endorses exercise and/or activity of:  Walking 35 to 40 minutes a day She works in:  Retired  She endorses ETOH use socially with less than 1-2 drinks a month She denies nictoine use  She denies illegal substance use   She is postmenopausal without any concerning symptoms.  She is currently sexually active with 1 partner (spouse) she has no concerns about STIs today  She denies concerns about skin changes today  She denies concerns about bowel changes today  She denies concerns about bladder changes today   Most Recent Depression Screen:     05/01/2022   10:37 AM 04/27/2021    9:42 AM 03/17/2021    9:29 AM  Depression screen PHQ 2/9  Decreased Interest 0 0 0  Down, Depressed, Hopeless 0 0 0  PHQ - 2 Score 0 0 0  Altered sleeping  0   Tired, decreased energy  0   Change in appetite  0   Feeling bad or failure about yourself   0   Trouble concentrating  0   Moving slowly or fidgety/restless  0   Suicidal thoughts  0   PHQ-9 Score  0    Most Recent Anxiety Screen:     04/27/2021    9:42 AM  GAD 7 : Generalized Anxiety Score  Nervous, Anxious, on Edge 0  Control/stop worrying 0  Worry too much - different things 0  Trouble relaxing 0  Restless 0  Easily annoyed or irritable 0  Afraid - awful might happen 0  Total GAD 7 Score 0   Most Recent Fall Screen:    05/01/2022   10:36 AM 04/27/2021    9:42 AM  Fall Risk   Falls in the past year? 0 0  Number falls in past yr: 0 0  Injury with Fall? 0  0  Risk for fall due to : Other (Comment) No Fall Risks  Follow up Education provided;Falls evaluation completed Falls evaluation completed    All ROS negative except what is listed above and in the HPI.   Past medical history, surgical history, medications, allergies, family history and social history reviewed with patient today and changes made to appropriate areas of the chart.  Past Medical History:  Past Medical History:  Diagnosis Date   Erythema nodosum 1980's   Osteoporosis    Rheumatoid aortitis    [place]  Shingles 02/2020   face   Medications:  Current Outpatient Medications on File Prior to Visit  Medication Sig   Calcium Citrate-Vitamin D (CALCIUM + D PO) Take by mouth daily.   folic acid (FOLVITE) 8456MCG tablet Take 800 mcg by mouth daily.   HUMIRA PEN 40 MG/0.4ML PNKT SMARTSIG:40 Milligram(s) SUB-Q Every 2 Weeks   methotrexate 2.5 MG tablet Take 10 mg by mouth once a week.   No current facility-administered medications on file prior to visit.  Surgical History:  Past Surgical History:  Procedure Laterality Date   CERVICAL POLYPECTOMY  2003   TONSILLECTOMY  1958   TUBAL LIGATION     BTL   Allergies:  Allergies  Allergen Reactions   Yellow Jacket Venom Anaphylaxis, Hives and Shortness Of Breath   Levaquin [Levofloxacin In D5w] Other (See Comments)    tendonopathy    Macrobid [Nitrofurantoin Macrocrystal] Hives   Ciprofloxacin Rash   Social History:  Social History   Socioeconomic History   Marital status: Married    Spouse name: Not on file   Number of children: Not on file   Years of education: Not on file   Highest education level: Not on file  Occupational History   Not on file  Tobacco Use   Smoking status: Never   Smokeless tobacco: Never  Vaping Use   Vaping Use: Never used  Substance and Sexual Activity   Alcohol use: Yes    Comment: 2 glasses of wine/month   Drug use: No   Sexual activity: Yes    Birth  control/protection: Other-see comments, Post-menopausal    Comment: BTL  Other Topics Concern   Not on file  Social History Narrative   Not on file   Social Determinants of Health   Financial Resource Strain: Not on file  Food Insecurity: Not on file  Transportation Needs: Not on file  Physical Activity: Not on file  Stress: Not on file  Social Connections: Not on file  Intimate Partner Violence: Not on file   Social History   Tobacco Use  Smoking Status Never  Smokeless Tobacco Never   Social History   Substance and Sexual Activity  Alcohol Use Yes   Comment: 2 glasses of wine/month   Family History:  Family History  Problem Relation Age of Onset   Heart disease Mother    Hyperlipidemia Brother    Heart attack Brother    Heart disease Maternal Grandmother    Breast cancer Maternal Grandmother    Cancer - Lung Maternal Grandfather    Brain cancer Paternal Grandmother    Breast cancer Maternal Aunt        Objective:    BP 112/76   Pulse 62   Ht '5\' 5"'$  (1.651 m)   Wt 175 lb 6.4 oz (79.6 kg)   LMP 09/26/2009   BMI 29.19 kg/m   Wt Readings from Last 3 Encounters:  05/01/22 175 lb 6.4 oz (79.6 kg)  04/27/21 163 lb 9.6 oz (74.2 kg)  03/17/21 161 lb 6.4 oz (73.2 kg)    Physical Exam  Results for orders placed or performed during the hospital encounter of 04/27/21  Hemoglobin A1c  Result Value Ref Range   Hgb A1c MFr Bld 5.5 4.8 - 5.6 %   Mean Plasma Glucose 111 mg/dL  TSH  Result Value Ref Range   TSH 2.836 0.350 - 4.500 uIU/mL  Lipid panel  Result Value Ref Range   Cholesterol 193 0 - 200 mg/dL   Triglycerides 56 <150 mg/dL   HDL 66 >40 mg/dL   Total CHOL/HDL Ratio 2.9 RATIO   VLDL 11 0 - 40 mg/dL   LDL Cholesterol 116 (H) 0 - 99 mg/dL  Comprehensive metabolic panel  Result Value Ref Range   Sodium 141 135 - 145 mmol/L   Potassium 4.3 3.5 - 5.1 mmol/L   Chloride 104 98 - 111 mmol/L   CO2 29 22 - 32 mmol/L   Glucose, Bld 92 70 - 99 mg/dL  BUN  18 8 - 23 mg/dL   Creatinine, Ser 0.63 0.44 - 1.00 mg/dL   Calcium 9.1 8.9 - 10.3 mg/dL   Total Protein 7.2 6.5 - 8.1 g/dL   Albumin 4.4 3.5 - 5.0 g/dL   AST 17 15 - 41 U/L   ALT 14 0 - 44 U/L   Alkaline Phosphatase 49 38 - 126 U/L   Total Bilirubin 0.6 0.3 - 1.2 mg/dL   GFR, Estimated >60 >60 mL/min   Anion gap 8 5 - 15  CBC w/Diff/Platelet  Result Value Ref Range   WBC 6.6 4.0 - 10.5 K/uL   RBC 4.32 3.87 - 5.11 MIL/uL   Hemoglobin 12.7 12.0 - 15.0 g/dL   HCT 38.0 36.0 - 46.0 %   MCV 88.0 80.0 - 100.0 fL   MCH 29.4 26.0 - 34.0 pg   MCHC 33.4 30.0 - 36.0 g/dL   RDW 12.8 11.5 - 15.5 %   Platelets 293 150 - 400 K/uL   nRBC 0.0 0.0 - 0.2 %   Neutrophils Relative % 50 %   Neutro Abs 3.4 1.7 - 7.7 K/uL   Lymphocytes Relative 37 %   Lymphs Abs 2.4 0.7 - 4.0 K/uL   Monocytes Relative 6 %   Monocytes Absolute 0.4 0.1 - 1.0 K/uL   Eosinophils Relative 6 %   Eosinophils Absolute 0.4 0.0 - 0.5 K/uL   Basophils Relative 1 %   Basophils Absolute 0.0 0.0 - 0.1 K/uL   Immature Granulocytes 0 %   Abs Immature Granulocytes 0.01 0.00 - 0.07 K/uL      Assessment & Plan:   Problem List Items Addressed This Visit   None Visit Diagnoses     Screening for colon cancer    -  Primary   Relevant Orders   Cologuard   Need for shingles vaccine         Overall healthy 66 year old female.  No alarm symptoms present on evaluation today. Labs recently obtained by another provider.  Did review these during the visit today.  We will work to get a copy of these.  No need for additional labs at this time. Cologuard due at this time will order today. Patient has recently had shingles however she is due for the shingles vaccine.  Prescription provided today All COVID vaccines have been completed We will work to obtain records of pneumonia vaccine, COVID-vaccine.   IMMUNIZATIONS:   - Tdap: Tetanus vaccination status reviewed: last tetanus booster within 10 years. - Influenza: Postponed to flu  season - Pneumovax: Up to date - Prevnar: Up to date - HPV: Not applicable - Zostavax vaccine:  Prescription provided  SCREENING: - Pap smear:  No longer needed - STI testing: deferred -Mammogram: Up to date  - Colonoscopy:  Cologuard ordered today   - Bone Density: Up to date  -Hearing Test: Not applicable  -Spirometry: Not applicable   Follow up plan: Return in about 1 year (around 05/02/2023) for CPE with labs.  NEXT PREVENTATIVE PHYSICAL DUE IN 1 YEAR.  PATIENT COUNSELING PROVIDED:   For all adult patients, I recommend   A well balanced diet low in saturated fats, cholesterol, and moderation in carbohydrates.   This can be as simple as monitoring portion sizes and cutting back on sugary beverages such as soda and juice to start with.    Daily water consumption of at least 64 ounces.  Physical activity at least 180 minutes per week, if just starting out.   This  can be as simple as taking the stairs instead of the elevator and walking 2-3 laps around the office  purposefully every day.   STD protection, partner selection, and regular testing if high risk.  Limited consumption of alcoholic beverages if alcohol is consumed.  For women, I recommend no more than 7 alcoholic beverages per week, spread out throughout the week.  Avoid "binge" drinking or consuming large quantities of alcohol in one setting.   Please let me know if you feel you may need help with reduction or quitting alcohol consumption.   Avoidance of nicotine, if used.  Please let me know if you feel you may need help with reduction or quitting nicotine use.   Daily mental health attention.  This can be in the form of 5 minute daily meditation, prayer, journaling, yoga, reflection, etc.   Purposeful attention to your emotions and mental state can significantly improve your overall wellbeing and Health.  Please know that I am here to help you with all of your health care goals and am happy to work with you to  find a solution that works best for you.  The greatest advice I have received with any changes in life are to take it one step at a time, that even means if all you can focus on is the next 60 seconds, then do that and celebrate your victories.  With any changes in life, you will have set backs, and that is OK. The important thing to remember is, if you have a set back, it is not a failure, it is an opportunity to try again!  Health Maintenance Recommendations Screening Testing Mammogram Every 1 -2 years based on history and risk factors Starting at age 59 Pap Smear Ages 21-39 every 3 years Ages 70-65 every 5 years with HPV testing More frequent testing may be required based on results and history Colon Cancer Screening Every 1-10 years based on test performed, risk factors, and history Starting at age 59 Bone Density Screening Every 2-10 years based on history Starting at age 14 for women Recommendations for men differ based on medication usage, history, and risk factors AAA Screening One time ultrasound Men 25-57 years old who have every smoked Lung Cancer Screening Low Dose Lung CT every 12 months Age 77-80 years with a 30 pack-year smoking history who still smoke or who have quit within the last 15 years  Screening Labs Routine  Labs: Complete Blood Count (CBC), Complete Metabolic Panel (CMP), Cholesterol (Lipid Panel) Every 6-12 months based on history and medications May be recommended more frequently based on current conditions or previous results Hemoglobin A1c Lab Every 3-12 months based on history and previous results Starting at age 43 or earlier with diagnosis of diabetes, high cholesterol, BMI >26, and/or risk factors Frequent monitoring for patients with diabetes to ensure blood sugar control Thyroid Panel (TSH w/ T3 & T4) Every 6 months based on history, symptoms, and risk factors May be repeated more often if on medication HIV One time testing for all patients  66 and older May be repeated more frequently for patients with increased risk factors or exposure Hepatitis C One time testing for all patients 30 and older May be repeated more frequently for patients with increased risk factors or exposure Gonorrhea, Chlamydia Every 12 months for all sexually active persons 13-24 years Additional monitoring may be recommended for those who are considered high risk or who have symptoms PSA Men 81-49 years old with risk factors Additional screening  may be recommended from age 53-69 based on risk factors, symptoms, and history  Vaccine Recommendations Tetanus Booster All adults every 10 years Flu Vaccine All patients 6 months and older every year COVID Vaccine All patients 12 years and older Initial dosing with booster May recommend additional booster based on age and health history HPV Vaccine 2 doses all patients age 2-26 Dosing may be considered for patients over 26 Shingles Vaccine (Shingrix) 2 doses all adults 85 years and older Pneumonia (Pneumovax 23) All adults 54 years and older May recommend earlier dosing based on health history Pneumonia (Prevnar 60) All adults 56 years and older Dosed 1 year after Pneumovax 23  Additional Screening, Testing, and Vaccinations may be recommended on an individualized basis based on family history, health history, risk factors, and/or exposure.

## 2022-05-01 NOTE — Patient Instructions (Addendum)
It was a pleasure seeing you today. I hope your time spent with [place] was pleasant and helpful. Please let us know if there is anything we can do to improve the service you receive.   I will work to see if we can pull the labs from Dr. Melissa Noon office. Everything looks so good today!       Important Office Information Lab Results If labs were ordered, please note that you will see results through [place] as soon as they come available from [place].  It takes up to 5 business days for the results to be routed to me and for me to review them once all of the lab results have come through from [facility]. I will make recommendations based on your results and send these through [place] or someone from the office will call you to discuss. If your labs are abnormal, we may contact you to schedule a visit to discuss the results and make recommendations.  If you have not heard from [place] within 5 business days or you have waited longer than a week and your lab results have not come through on [place], please feel free to call the office or send a message through [place] to follow-up on these labs.   Referrals If referrals were placed today, the office where the referral was sent will contact you either by phone or through [place] to set up scheduling. Please note that it can take up to a week for the referral office to contact you. If you do not hear from them in a week, please contact the referral office directly to inquire about scheduling.   Condition Treated If your condition worsens or you begin to have new symptoms, please schedule a follow-up appointment for further evaluation. If you are not sure if an appointment is needed, you may call the office to leave a message for the nurse and someone will contact you with recommendations.  If you have an urgent or life threatening emergency, please do not call the office, but seek emergency evaluation by calling 911 or going to the nearest emergency room for  evaluation.   MyChart and Phone Calls Please do not use MyChart for urgent messages. It may take up to 3 business days for MyChart messages to be read by staff and if they are unable to handle the request, an additional 3 business days for them to be routed to me and for my response.  Messages sent to the provider through [place] do not come directly to the provider, please allow time for these messages to be routed and for me to respond.  We get a large volume of MyChart messages daily and these are responded to in the order received.   For urgent messages, please call the office at [phone] and speak with the front office staff or leave a message on the line of my assistant for guidance.  We are seeing patients from the hours of 8:00 am through 5:00 pm and calls directly to the nurse may not be answered immediately due to seeing patients, but your call will be returned as soon as possible.  Phone  messages received after 4:00 PM Monday through Thursday may not be returned until the following business day. Phone messages received after 11:00 AM on Friday may not be returned until Monday.   After Hours We share on call hours with providers from other offices. If you have an urgent need after hours that cannot wait until the next business day, please  contact the on call provider by calling the office number. A nurse will speak with you and contact the provider if needed for recommendations.  If you have an urgent or life threatening emergency after hours, please do not call the on call provider, but seek emergency evaluation by calling 911 or going to the nearest emergency room for evaluation.   Paperwork All paperwork requires a minimum of 5 days to complete and return to you or the designated personnel. Please keep this in mind when bringing in forms or sending requests for paperwork completion to the office.

## 2022-05-02 DIAGNOSIS — M1991 Primary osteoarthritis, unspecified site: Secondary | ICD-10-CM | POA: Insufficient documentation

## 2022-05-18 DIAGNOSIS — Z1211 Encounter for screening for malignant neoplasm of colon: Secondary | ICD-10-CM | POA: Diagnosis not present

## 2022-05-24 DIAGNOSIS — M1991 Primary osteoarthritis, unspecified site: Secondary | ICD-10-CM | POA: Diagnosis not present

## 2022-05-24 DIAGNOSIS — M0579 Rheumatoid arthritis with rheumatoid factor of multiple sites without organ or systems involvement: Secondary | ICD-10-CM | POA: Diagnosis not present

## 2022-05-24 DIAGNOSIS — M25551 Pain in right hip: Secondary | ICD-10-CM | POA: Diagnosis not present

## 2022-05-24 DIAGNOSIS — M810 Age-related osteoporosis without current pathological fracture: Secondary | ICD-10-CM | POA: Diagnosis not present

## 2022-05-30 LAB — COLOGUARD: COLOGUARD: POSITIVE — AB

## 2022-06-01 ENCOUNTER — Other Ambulatory Visit (HOSPITAL_BASED_OUTPATIENT_CLINIC_OR_DEPARTMENT_OTHER): Payer: Self-pay | Admitting: Nurse Practitioner

## 2022-06-01 DIAGNOSIS — R195 Other fecal abnormalities: Secondary | ICD-10-CM

## 2022-06-06 ENCOUNTER — Encounter: Payer: Self-pay | Admitting: Gastroenterology

## 2022-06-29 ENCOUNTER — Ambulatory Visit (AMBULATORY_SURGERY_CENTER): Payer: Self-pay | Admitting: *Deleted

## 2022-06-29 VITALS — Ht 65.0 in | Wt 177.0 lb

## 2022-06-29 DIAGNOSIS — Z1211 Encounter for screening for malignant neoplasm of colon: Secondary | ICD-10-CM

## 2022-06-29 MED ORDER — NA SULFATE-K SULFATE-MG SULF 17.5-3.13-1.6 GM/177ML PO SOLN: 1.0000 | ORAL | 0 refills | Status: DC

## 2022-06-29 NOTE — Progress Notes (Signed)
Patient is here in-person for PV. Patient denies any allergies to eggs or soy. Patient denies any problems with anesthesia/sedation. Patient is not on any oxygen at home. Patient is not taking any diet/weight loss medications or blood thinners. Went over procedure prep instructions with the patient. Patient is aware of our care-partner policy. Patient notified to use Good-Rx for prescription. Patient will use Miralax daily with suprep-she has BM 2 times a week.

## 2022-07-18 ENCOUNTER — Encounter: Payer: Self-pay | Admitting: Gastroenterology

## 2022-07-27 ENCOUNTER — Encounter: Payer: Self-pay | Admitting: Gastroenterology

## 2022-07-27 ENCOUNTER — Ambulatory Visit (AMBULATORY_SURGERY_CENTER): Payer: Medicare Other | Admitting: Gastroenterology

## 2022-07-27 VITALS — BP 99/52 | HR 64 | Temp 97.5°F | Resp 12 | Ht 65.0 in | Wt 177.0 lb

## 2022-07-27 DIAGNOSIS — D120 Benign neoplasm of cecum: Secondary | ICD-10-CM

## 2022-07-27 DIAGNOSIS — D122 Benign neoplasm of ascending colon: Secondary | ICD-10-CM

## 2022-07-27 DIAGNOSIS — Z800 Family history of malignant neoplasm of digestive organs: Secondary | ICD-10-CM | POA: Diagnosis not present

## 2022-07-27 DIAGNOSIS — Z1211 Encounter for screening for malignant neoplasm of colon: Secondary | ICD-10-CM | POA: Diagnosis not present

## 2022-07-27 DIAGNOSIS — K635 Polyp of colon: Secondary | ICD-10-CM | POA: Diagnosis not present

## 2022-07-27 DIAGNOSIS — D123 Benign neoplasm of transverse colon: Secondary | ICD-10-CM

## 2022-07-27 DIAGNOSIS — R195 Other fecal abnormalities: Secondary | ICD-10-CM

## 2022-07-27 DIAGNOSIS — M069 Rheumatoid arthritis, unspecified: Secondary | ICD-10-CM | POA: Diagnosis not present

## 2022-07-27 MED ORDER — SODIUM CHLORIDE 0.9 % IV SOLN: 500.0000 mL | Freq: Once | INTRAVENOUS | Status: DC

## 2022-07-27 NOTE — Progress Notes (Signed)
PT taken to PACU. Monitors in place. VSS. Report given to RN. 

## 2022-07-27 NOTE — Patient Instructions (Signed)
Thank you for coming in to see [place] today! Resume previous diet and medications today. Return to regular daily activities tomorrow. Polyp biopsies will be available in 1-2 weeks  Recommendations will be made at that time for future colonoscopy.   YOU HAD AN ENDOSCOPIC PROCEDURE TODAY AT [facility]:   Refer to the procedure report that was given to you for any specific questions about what was found during the examination.  If the procedure report does not answer your questions, please call your gastroenterologist to clarify.  If you requested that your care partner not be given the details of your procedure findings, then the procedure report has been included in a sealed envelope for you to review at your convenience later.  YOU SHOULD EXPECT: Some feelings of bloating in the abdomen. Passage of more gas than usual.  Walking can help get rid of the air that was put into your GI tract during the procedure and reduce the bloating. If you had a lower endoscopy (such as a colonoscopy or flexible sigmoidoscopy) you may notice spotting of blood in your stool or on the toilet paper. If you underwent a bowel prep for your procedure, you may not have a normal bowel movement for a few days.  Please Note:  You might notice some irritation and congestion in your nose or some drainage.  This is from the oxygen used during your procedure.  There is no need for concern and it should clear up in a day or so.  SYMPTOMS TO REPORT IMMEDIATELY:  Following lower endoscopy (colonoscopy or flexible sigmoidoscopy):  Excessive amounts of blood in the stool  Significant tenderness or worsening of abdominal pains  Swelling of the abdomen that is new, acute  Fever of 100F or higher    For urgent or emergent issues, a gastroenterologist can be reached at any hour by calling [phone]. Do not use MyChart messaging for urgent concerns.    DIET:  We do recommend a small meal at first, but then you  may proceed to your regular diet.  Drink plenty of fluids but you should avoid alcoholic beverages for 24 hours.  ACTIVITY:  You should plan to take it easy for the rest of today and you should NOT DRIVE or use heavy machinery until tomorrow (because of the sedation medicines used during the test).    FOLLOW UP: Our staff will call the number listed on your records the next business day following your procedure.  We will call around 7:15- 8:00 am to check on you and address any questions or concerns that you may have regarding the information given to you following your procedure. If we do not reach you, we will leave a message.  If you develop any symptoms (ie: fever, flu-like symptoms, shortness of breath, cough etc.) before then, please call [phone].  If you test positive for Covid 19 in the 2 weeks post procedure, please call and report this information to [place].    If any biopsies were taken you will be contacted by phone or by letter within the next 1-3 weeks.  Please call us at [phone] if you have not heard about the biopsies in 3 weeks.    SIGNATURES/CONFIDENTIALITY: You and/or your care partner have signed paperwork which will be entered into your electronic medical record.  These signatures attest to the fact that that the information above on your After Visit Summary has been reviewed and is understood.  Full responsibility of the confidentiality  of this discharge information lies with you and/or your care-partner.  

## 2022-07-27 NOTE — Progress Notes (Signed)
Called to room to assist during endoscopic procedure.  Patient ID and intended procedure confirmed with present staff. Received instructions for my participation in the procedure from the performing physician.  

## 2022-07-27 NOTE — Progress Notes (Signed)
Pt's states no medical or surgical changes since previsit or office visit. 

## 2022-07-27 NOTE — Progress Notes (Signed)
   Referring Provider: Orma Render, NP Primary Care Physician:  Orma Render, NP  Indication for Colonoscopy:  Cologuard positive   IMPRESSION:  Cologuard + Need for colon cancer screening Brother with colon polyps Appropriate candidate for monitored anesthesia care  PLAN: Colonoscopy in the [facility] today   HPI: Jill Lara is a 66 y.o. female presents for + Cologuard 05/18/22. She had a normal Cologuard 2020.  No prior colonoscopy.  Brother with colon polyps. No known family history of colon cancer or polyps. No family history of uterine/endometrial cancer, pancreatic cancer or gastric/stomach cancer.   Past Medical History:  Diagnosis Date   Arthritis    Erythema nodosum 1980's   Osteoporosis    Rheumatoid aortitis    Dr. Gemma Payor in [place]   Shingles 02/2020   face    Past Surgical History:  Procedure Laterality Date   CERVICAL POLYPECTOMY  2003   TONSILLECTOMY  1958   TUBAL LIGATION     BTL    Current Outpatient Medications  Medication Sig Dispense Refill   Calcium Citrate-Vitamin D (CALCIUM + D PO) Take by mouth daily.     folic acid (FOLVITE) 983 MCG tablet Take 800 mcg by mouth daily.     HUMIRA PEN 40 MG/0.4ML PNKT SMARTSIG:40 Milligram(s) SUB-Q Every 2 Weeks     methotrexate 2.5 MG tablet Take 10 mg by mouth once a week.     Current Facility-Administered Medications  Medication Dose Route Frequency Provider Last Rate Last Admin   0.9 %  sodium chloride infusion  500 mL Intravenous Once Thornton Park, MD        Allergies as of 07/27/2022 - Review Complete 07/27/2022  Allergen Reaction Noted   Yellow jacket venom Anaphylaxis, Hives, and Shortness Of Breath 04/27/2021   Levaquin [levofloxacin in d5w] Other (See Comments) 01/15/2014   Macrobid [nitrofurantoin macrocrystal] Hives 04/09/2013   Ciprofloxacin Rash 04/09/2013    Family History  Problem Relation Age of Onset   Heart disease Mother    Hyperlipidemia Brother    Heart attack Brother     Colon polyps Brother    Breast cancer Maternal Aunt    Heart disease Maternal Grandmother    Breast cancer Maternal Grandmother    Cancer - Lung Maternal Grandfather    Brain cancer Paternal Grandmother    Colon cancer Neg Hx    Esophageal cancer Neg Hx    Stomach cancer Neg Hx    Rectal cancer Neg Hx      Physical Exam: General:   Alert,  well-nourished, pleasant and cooperative in NAD Head:  Normocephalic and atraumatic. Eyes:  Sclera clear, no icterus.   Conjunctiva pink. Mouth:  No deformity or lesions.   Neck:  Supple; no masses or thyromegaly. Lungs:  Clear throughout to auscultation.   No wheezes. Heart:  Regular rate and rhythm; no murmurs. Abdomen:  Soft, non-tender, nondistended, normal bowel sounds, no rebound or guarding.  Msk:  Symmetrical. No boney deformities LAD: No inguinal or umbilical LAD Extremities:  No clubbing or edema. Neurologic:  Alert and  oriented x4;  grossly nonfocal Skin:  No obvious rash or bruise. Psych:  Alert and cooperative. Normal mood and affect.     Studies/Results: No results found.    Jill Machnik L. Tarri Glenn, MD, MPH 07/27/2022, 9:04 AM

## 2022-07-27 NOTE — Op Note (Signed)
[facility] Patient Name: Jill Lara Procedure Date: 07/27/2022 9:11 AM MRN: [id number] Endoscopist: Thornton Park MD, MD Age: 66 Referring MD:  Date of Birth: June 20, 1956 Gender: Female Account #: [id number] Procedure:                Colonoscopy Indications:              Positive Cologuard test                           No prior colonoscopy                           Brother with colon polyps Medicines:                Monitored Anesthesia Care Procedure:                Pre-Anesthesia Assessment:                           - Prior to the procedure, a History and Physical                            was performed, and patient medications and                            allergies were reviewed. The patient's tolerance of                            previous anesthesia was also reviewed. The risks                            and benefits of the procedure and the sedation                            options and risks were discussed with the patient.                            All questions were answered, and informed consent                            was obtained. Prior Anticoagulants: The patient has                            taken no previous anticoagulant or antiplatelet                            agents. ASA Grade Assessment: II - A patient with                            mild systemic disease. After reviewing the risks                            and benefits, the patient was deemed in  satisfactory condition to undergo the procedure.                           After obtaining informed consent, the colonoscope                            was passed under direct vision. Throughout the                            procedure, the patient's blood pressure, pulse, and                            oxygen saturations were monitored continuously. The                            CF [id number] #[id number] was introduced through the anus                            and  advanced to the 3 cm into the ileum. A second                            forward view of the right colon was performed. The                            colonoscopy was performed without difficulty. The                            patient tolerated the procedure well. The quality                            of the bowel preparation was good. The terminal                            ileum, ileocecal valve, appendiceal orifice, and                            rectum were photographed. Scope In: 9:19:49 AM Scope Out: 9:45:50 AM Scope Withdrawal Time: 0 hours 20 minutes 32 seconds  Total Procedure Duration: 0 hours 26 minutes 1 second  Findings:                 The perianal and digital rectal examinations were                            normal.                           Non-bleeding internal hemorrhoids were found.                           Multiple small and large-mouthed diverticula were                            found in the sigmoid colon and descending colon.  Two sessile polyps were found in the transverse                            colon. The polyps were 3 to 5 mm in size. These                            polyps were removed with a cold snare. Resection                            and retrieval were complete. Estimated blood loss                            was minimal.                           A 6 mm polyp was found in the hepatic flexure. The                            polyp was sessile. The polyp was removed with a                            cold snare. Resection and retrieval were complete.                            Estimated blood loss was minimal.                           A 10 mm polyp was found in the ascending colon. The                            polyp was sessile. The polyp was removed with a                            cold snare. Resection and retrieval were complete.                            Estimated blood loss was minimal.                            A 6 mm polyp was found in the cecum. The polyp was                            flat. The polyp was removed with a cold snare.                            Resection and retrieval were complete. Estimated                            blood loss was minimal.                           The exam was otherwise without abnormality on  direct and retroflexion views. Complications:            No immediate complications. Estimated Blood Loss:     Estimated blood loss was minimal. Impression:               - Non-bleeding internal hemorrhoids.                           - Diverticulosis in the sigmoid colon and in the                            descending colon.                           - Two 3 to 5 mm polyps in the transverse colon,                            removed with a cold snare. Resected and retrieved.                           - One 6 mm polyp at the hepatic flexure, removed                            with a cold snare. Resected and retrieved.                           - One 10 mm polyp in the ascending colon, removed                            with a cold snare. Resected and retrieved.                           - One 6 mm polyp in the cecum, removed with a cold                            snare. Resected and retrieved.                           - The examination was otherwise normal on direct                            and retroflexion views. Recommendation:           - Patient has a contact number available for                            emergencies. The signs and symptoms of potential                            delayed complications were discussed with the                            patient. Return to normal activities tomorrow.  Written discharge instructions were provided to the                            patient.                           - Resume previous diet.                           - Continue present medications.                            - Await pathology results.                           - Repeat colonoscopy date to be determined after                            pending pathology results are reviewed for                            surveillance.                           - Follow a high fiber diet. Drink at least 64                            ounces of water daily. Add a daily stool bulking                            agent such as psyllium (an exampled would be                            Metamucil).                           - Emerging evidence supports eating a diet of                            fruits, vegetables, grains, calcium, and yogurt                            while reducing red meat and alcohol may reduce the                            risk of colon cancer.                           - Thank you for allowing me to be involved in your                            colon cancer prevention. Thornton Park MD, MD 07/27/2022 9:53:37 AM This report has been signed electronically.

## 2022-07-31 ENCOUNTER — Telehealth: Payer: Self-pay | Admitting: *Deleted

## 2022-07-31 NOTE — Telephone Encounter (Signed)
  Follow up Call-     07/27/2022    8:37 AM  Call back number  Post procedure Call Back phone  # [phone]  Permission to leave phone message Yes     Patient questions:  Do you have a fever, pain , or abdominal swelling? No. Pain Score  0 *  Have you tolerated food without any problems? Yes.    Have you been able to return to your normal activities? Yes.    Do you have any questions about your discharge instructions: Diet   No. Medications  No. Follow up visit  No.  Do you have questions or concerns about your Care? No.  Actions: * If pain score is 4 or above: No action needed, pain <4.   Follow up Call-     07/27/2022    8:37 AM  Call back number  Post procedure Call Back phone  # [phone]  Permission to leave phone message Yes     Patient questions:  Do you have a fever, pain , or abdominal swelling? No. Pain Score  0 *  Have you tolerated food without any problems? Yes.    Have you been able to return to your normal activities? Yes.    Do you have any questions about your discharge instructions: Diet   No. Medications  No. Follow up visit  No.  Do you have questions or concerns about your Care? No.  Actions: * If pain score is 4 or above: No action needed, pain <4.

## 2022-08-03 ENCOUNTER — Encounter: Payer: Self-pay | Admitting: Gastroenterology

## 2022-08-28 DIAGNOSIS — M0579 Rheumatoid arthritis with rheumatoid factor of multiple sites without organ or systems involvement: Secondary | ICD-10-CM | POA: Diagnosis not present

## 2022-09-14 DIAGNOSIS — H524 Presbyopia: Secondary | ICD-10-CM | POA: Diagnosis not present

## 2022-11-29 DIAGNOSIS — M0579 Rheumatoid arthritis with rheumatoid factor of multiple sites without organ or systems involvement: Secondary | ICD-10-CM | POA: Diagnosis not present

## 2022-11-29 DIAGNOSIS — M25551 Pain in right hip: Secondary | ICD-10-CM | POA: Diagnosis not present

## 2022-11-29 DIAGNOSIS — M1991 Primary osteoarthritis, unspecified site: Secondary | ICD-10-CM | POA: Diagnosis not present

## 2022-11-29 DIAGNOSIS — M810 Age-related osteoporosis without current pathological fracture: Secondary | ICD-10-CM | POA: Diagnosis not present

## 2023-01-24 ENCOUNTER — Ambulatory Visit (INDEPENDENT_AMBULATORY_CARE_PROVIDER_SITE_OTHER): Payer: Medicare Other | Admitting: Obstetrics & Gynecology

## 2023-01-24 ENCOUNTER — Encounter (HOSPITAL_BASED_OUTPATIENT_CLINIC_OR_DEPARTMENT_OTHER): Payer: Self-pay | Admitting: Obstetrics & Gynecology

## 2023-01-24 VITALS — BP 119/54 | HR 65 | Ht 64.5 in | Wt 176.6 lb

## 2023-01-24 DIAGNOSIS — M810 Age-related osteoporosis without current pathological fracture: Secondary | ICD-10-CM

## 2023-01-24 DIAGNOSIS — Z1231 Encounter for screening mammogram for malignant neoplasm of breast: Secondary | ICD-10-CM | POA: Diagnosis not present

## 2023-01-24 DIAGNOSIS — M05731 Rheumatoid arthritis with rheumatoid factor of right wrist without organ or systems involvement: Secondary | ICD-10-CM

## 2023-01-24 DIAGNOSIS — N952 Postmenopausal atrophic vaginitis: Secondary | ICD-10-CM

## 2023-01-24 DIAGNOSIS — Z9189 Other specified personal risk factors, not elsewhere classified: Secondary | ICD-10-CM

## 2023-01-24 MED ORDER — ESTRADIOL 0.1 MG/GM VA CREA: TOPICAL_CREAM | VAGINAL | 2 refills | Status: DC

## 2023-01-24 NOTE — Progress Notes (Signed)
67 y.o. G24P0011 [name] here for breast and pelvic exam.  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  She has not wanted to be on medication.  Trying to take more calcium and exercise.  Needs Bone density this year.  Order placed.  Has not done a mammogram since 2022.  Order will be placed.  Pt will try and do these on the same day.  Denies vaginal bleeding.  Having a lot of dryness.  Has used vaginal estrogen in the past.  Would like to try again.    [name] last menstrual period was 09/26/2009.          Sexually active: Yes.    H/O STD:  no  Health Maintenance: PCP:  Jacolyn Reedy.  Last wellness appt was 04/2021.  Did blood work at that appt:  yes Vaccines are up to date:  no Colonoscopy:  07/27/2022, follow up 3 years MMG:  04/27/2021  Negative.  Hasn't done one this year. BMD:  04/27/2021  Last pap smear:  03/17/2021 Negative.   H/o abnormal pap smear:  no    reports that she has never smoked. She has never used smokeless tobacco. She reports current alcohol use. She reports that she does not use drugs.  Past Medical History:  Diagnosis Date   Arthritis    Erythema nodosum 1980's   Osteoporosis    Rheumatoid aortitis    Dr. Gemma Payor in [place]   Shingles 02/2020   face    Past Surgical History:  Procedure Laterality Date   CERVICAL POLYPECTOMY  2003   TONSILLECTOMY  1958   TUBAL LIGATION     BTL    Current Outpatient Medications  Medication Sig Dispense Refill   Calcium Citrate-Vitamin D (CALCIUM + D PO) Take by mouth daily.     folic acid (FOLVITE) Q000111Q MCG tablet Take 800 mcg by mouth daily.     HUMIRA PEN 40 MG/0.4ML PNKT SMARTSIG:40 Milligram(s) SUB-Q Every 2 Weeks     methotrexate 2.5 MG tablet Take 10 mg by mouth once a week.     No current facility-administered medications for this visit.    Family History  Problem Relation Age of Onset   Heart disease Mother    Hyperlipidemia Brother    Heart attack Brother    Colon  polyps Brother    Breast cancer Maternal Aunt    Heart disease Maternal Grandmother    Breast cancer Maternal Grandmother    Cancer - Lung Maternal Grandfather    Brain cancer Paternal Grandmother    Colon cancer Neg Hx    Esophageal cancer Neg Hx    Stomach cancer Neg Hx    Rectal cancer Neg Hx     Review of Systems  Constitutional: Negative.   Genitourinary: Negative.     Exam:   BP (!) 119/54 (BP Location: Right Arm, Patient Position: Sitting, Cuff Size: Large)   Pulse 65   Ht 5' 4.5" (1.638 m)   Wt 176 lb 9.6 oz (80.1 kg)   LMP 09/26/2009   BMI 29.85 kg/m   Height: 5' 4.5" (163.8 cm)  General appearance: alert, cooperative and appears stated age Breasts: normal appearance, no masses or tenderness Abdomen: soft, non-tender; bowel sounds normal; no masses,  no organomegaly Lymph nodes: Cervical, supraclavicular, and axillary nodes normal.  No abnormal inguinal nodes palpated Neurologic: Grossly normal  Pelvic: External genitalia:  no lesions  Urethra:  normal appearing urethra with no masses, tenderness or lesions              Bartholins and Skenes: normal                 Vagina: normal appearing vagina with atrophic changes and no discharge, no lesions              Cervix: no lesions              Pap taken: No. Bimanual Exam:  Uterus:  normal size, contour, position, consistency, mobility, non-tender              Adnexa: no mass, fullness, tenderness               Rectovaginal: Confirms               Anus:  normal sphincter tone, no lesions  Chaperone, Octaviano Batty, CMA, was present for exam.  Assessment/Plan: 1. GYN exam for high-risk Medicare patient - Pap smear not obtained today - Mammogram ordered - Colonoscopy 2022 - Bone mineral density ordered - lab work done with Clarise Cruz Early - vaccines reviewed/updated  2. Encounter for screening mammogram for malignant neoplasm of breast - MM 3D SCREEN BREAST BILATERAL; Future  3. Age-related  osteoporosis without current pathological fracture - DG BONE DENSITY (DXA); Future  4. Atrophic vaginitis - estradiol (ESTRACE) 0.1 MG/GM vaginal cream; Place 1 gram vaginally twice weekly.  Dispense: 42.5 g; Refill: 2  5. 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

## 2023-01-24 NOTE — Patient Instructions (Signed)
Call [phone] to scheduled at the [facility], [address] [place]  [place], [place] [address]  Mammograms can also be self-scheduled online through [facility].

## 2023-02-07 ENCOUNTER — Telehealth: Payer: Self-pay | Admitting: Nurse Practitioner

## 2023-02-07 NOTE — Telephone Encounter (Signed)
Contacted Annecia Becton to schedule their annual wellness visit. Appointment made for 02/12/23.  Barkley Boards AWV direct phone # [phone]

## 2023-02-12 ENCOUNTER — Ambulatory Visit (INDEPENDENT_AMBULATORY_CARE_PROVIDER_SITE_OTHER): Payer: Medicare Other

## 2023-02-12 VITALS — Ht 64.0 in | Wt 175.0 lb

## 2023-02-12 DIAGNOSIS — Z0000 Encounter for general adult medical examination without abnormal findings: Secondary | ICD-10-CM | POA: Diagnosis not present

## 2023-02-12 NOTE — Progress Notes (Cosign Needed Addendum)
I connected with  Jill Lara on 02/12/23 by a audio enabled telemedicine application and verified that I am speaking with the correct person using two identifiers.  Patient Location: Home  Provider Location: Office/Clinic  I discussed the limitations of evaluation and management by telemedicine. The patient expressed understanding and agreed to proceed.  Subjective:   Jill Lara is a 67 y.o. female who presents for an Initial Medicare Annual Wellness Visit.  Patient Medicare AWV questionnaire was completed by the patient on 02/08/2023; I have confirmed that all information answered by patient is correct and no changes since this date.     Review of Systems     Cardiac Risk Factors include: advanced age (>54men, >59 women);obesity (BMI >30kg/m2)     Objective:    Today's Vitals   02/12/23 1541  Weight: 175 lb (79.4 kg)  Height: 5\' 4"  (1.626 m)   Body mass index is 30.04 kg/m.     02/12/2023    3:45 PM 07/23/2018    8:02 AM  Advanced Directives  Does Patient Have a Medical Advance Directive? Yes No  Type of Paramedic of [place];Living will   Copy of [place] in Chart? No - copy requested   Would patient like information on creating a medical advance directive?  No - Patient declined    Current Medications (verified) Outpatient Encounter Medications as of 02/12/2023  Medication Sig   Calcium Citrate-Vitamin D (CALCIUM + D PO) Take by mouth daily.   estradiol (ESTRACE) 0.1 MG/GM vaginal cream Place 1 gram vaginally twice weekly.   folic acid (FOLVITE) [id number] MCG tablet Take 800 mcg by mouth daily.   HUMIRA PEN 40 MG/0.4ML PNKT SMARTSIG:40 Milligram(s) SUB-Q Every 2 Weeks   methotrexate 2.5 MG tablet Take 10 mg by mouth once a week.   No facility-administered encounter medications on file as of 02/12/2023.    Allergies (verified) Yellow jacket venom, Levaquin [levofloxacin in d5w], Macrobid [nitrofurantoin macrocrystal], and  Ciprofloxacin   History: Past Medical History:  Diagnosis Date   Arthritis    Erythema nodosum 1980's   Osteoporosis    Rheumatoid aortitis    Dr. Gemma Payor in [place]   Shingles 02/2020   face   Past Surgical History:  Procedure Laterality Date   CERVICAL POLYPECTOMY  2003   [place]     BTL   Family History  Problem Relation Age of Onset   Heart disease Mother    Hyperlipidemia Brother    Heart attack Brother    Colon polyps Brother    Breast cancer Maternal Aunt    Heart disease Maternal Grandmother    Breast cancer Maternal Grandmother    Cancer - Lung Maternal Grandfather    Brain cancer Paternal Grandmother    Colon cancer Neg Hx    Esophageal cancer Neg Hx    Stomach cancer Neg Hx    Rectal cancer Neg Hx    Social History   Socioeconomic History   Marital status: Married    Spouse name: Not on file   Number of children: Not on file   Years of education: Not on file   Highest education level: Not on file  Occupational History   Not on file  Tobacco Use   Smoking status: Never   Smokeless tobacco: Never  Vaping Use   Vaping Use: Never used  Substance and Sexual Activity   Alcohol use: Yes    Comment: occ wine-2 times month  per pt   Drug use: No   Sexual activity: Yes    Birth control/protection: Other-see comments, Post-menopausal    Comment: BTL  Other Topics Concern   Not on file  Social History Narrative   Not on file   Social Determinants of Health   Financial Resource Strain: Low Risk  (02/08/2023)   Overall Financial Resource Strain (CARDIA)    Difficulty of Paying Living Expenses: Not hard at all  Food Insecurity: No Food Insecurity (02/12/2023)   Hunger Vital Sign    Worried About Running Out of Food in the Last Year: Never true    Ran Out of Food in the Last Year: Never true  Transportation Needs: No Transportation Needs (02/08/2023)   PRAPARE - Hydrologist (Medical): No     Lack of Transportation (Non-Medical): No  Physical Activity: Sufficiently Active (02/08/2023)   Exercise Vital Sign    Days of Exercise per Week: 7 days    Minutes of Exercise per Session: 40 min  Stress: No Stress Concern Present (02/08/2023)   [place]    Feeling of Stress : Not at all  Social Connections: Unknown (02/08/2023)   Social Connection and Isolation Panel [NHANES]    Frequency of Communication with Friends and Family: Twice a week    Frequency of Social Gatherings with Friends and Family: Once a week    Attends Religious Services: Not on Advertising copywriter or Organizations: Yes    Attends Music therapist: More than 4 times per year    Marital Status: Married    Tobacco Counseling Counseling given: Not Answered   Clinical Intake:  Pre-visit preparation completed: Yes  Pain : No/denies pain     Nutritional Status: BMI > 30  Obese Nutritional Risks: None Diabetes: No  How often do you need to have someone help you when you read instructions, pamphlets, or other written materials from your doctor or pharmacy?: 1 - Never  Diabetic? no  Interpreter Needed?: No  Information entered by :: NAllen LPN   Activities of Daily Living    02/08/2023    4:44 PM 08/29/2022    2:19 PM  In your present state of health, do you have any difficulty performing the following activities:  Hearing? 0 0  Vision? 0 1  Difficulty concentrating or making decisions? 0 0  Walking or climbing stairs? 0 0  Dressing or bathing? 0 0  Doing errands, shopping? 0 0  Preparing Food and eating ? N   Using the Toilet? N   In the past six months, have you accidently leaked urine? N   Do you have problems with loss of bowel control? N   Managing your Medications? N   Managing your Finances? N   Housekeeping or managing your Housekeeping? N     Patient Care Team: Early, Coralee Pesa, NP as PCP - General  (Nurse Practitioner)  Indicate any recent Medical Services you may have received from other than [facility] providers in the past year (date may be approximate).     Assessment:   This is a routine wellness examination for Jill Lara.  Hearing/Vision screen Vision Screening - Comments:: Regular eye exams,   Dietary issues and exercise activities discussed: Current Exercise Habits: Home exercise routine, Type of exercise: walking, Time (Minutes): 40, Frequency (Times/Week): 7, Weekly Exercise (Minutes/Week): 280   Goals Addressed  This Visit's Progress    Patient Stated       02/12/2023, wants to lose weight       Depression Screen    02/12/2023    3:46 PM 01/24/2023   10:21 AM 08/29/2022    2:18 PM 05/01/2022   10:37 AM 04/27/2021    9:42 AM 03/17/2021    9:29 AM  PHQ 2/9 Scores  PHQ - 2 Score 0 0 0 0 0 0  PHQ- 9 Score 0  1  0   Exception Documentation    Medical reason      Fall Risk    02/08/2023    4:44 PM 08/29/2022    2:18 PM 05/01/2022   10:36 AM 04/27/2021    9:42 AM  [place] in the past year? 0 0 0 0  Number falls in past yr: 0 0 0 0  Injury with Fall? 0 0 0 0  Risk for fall due to : Medication side effect No Fall Risks Other (Comment) No Fall Risks  Follow up Falls prevention discussed;Education provided;Falls evaluation completed Falls evaluation completed;Education provided Education provided;Falls evaluation completed Falls evaluation completed    FALL RISK PREVENTION PERTAINING TO THE HOME:  Any stairs in or around the home? Yes  If so, are there any without handrails? No  Home free of loose throw rugs in walkways, pet beds, electrical cords, etc? Yes  Adequate lighting in your home to reduce risk of falls? Yes   ASSISTIVE DEVICES UTILIZED TO PREVENT FALLS:  Life alert? No  Use of a cane, walker or w/c? No  Grab bars in the bathroom? Yes  Shower chair or bench in shower? No  Elevated toilet seat or a handicapped toilet? Yes   TIMED UP AND  GO:  Was the test performed? No .      Cognitive Function:        02/12/2023    3:47 PM  6CIT Screen  What Year? 0 points  What month? 0 points  What time? 0 points  Count back from 20 0 points  Months in reverse 0 points  Repeat phrase 0 points  Total Score 0 points    Immunizations Immunization History  Administered Date(s) Administered   Fluad Quad(high Dose 65+) 09/30/2022   Moderna SARS-COV2 Booster Vaccination 07/22/2020   Moderna Sars-Covid-2 Vaccination 12/17/2019, 01/11/2020   Tdap 10/31/2006, 11/26/2012   Unspecified SARS-COV-2 Vaccination 10/25/2022    TDAP status: Due, Education has been provided regarding the importance of this vaccine. Advised may receive this vaccine at local pharmacy or Health Dept. Aware to provide a copy of the vaccination record if obtained from local pharmacy or Health Dept. Verbalized acceptance and understanding.  Flu Vaccine status: Up to date  Pneumococcal vaccine status: Due, Education has been provided regarding the importance of this vaccine. Advised may receive this vaccine at local pharmacy or Health Dept. Aware to provide a copy of the vaccination record if obtained from local pharmacy or Health Dept. Verbalized acceptance and understanding.  Covid-19 vaccine status: Completed vaccines  Qualifies for Shingles Vaccine? Yes   Zostavax completed No   Shingrix Completed?: No.    Education has been provided regarding the importance of this vaccine. Patient has been advised to call insurance company to determine out of pocket expense if they have not yet received this vaccine. Advised may also receive vaccine at local pharmacy or Health Dept. Verbalized acceptance and understanding.  Screening Tests Health Maintenance  Topic Date Due  Medicare Annual Wellness (AWV)  Never done   Zoster Vaccines- Shingrix (1 of 2) Never done   Pneumonia Vaccine 38+ Years old (1 of 1 - PCV) Never done   DTaP/Tdap/Td (3 - Td or Tdap) 11/26/2022    COVID-19 Vaccine (4 - 2023-24 season) 12/20/2022   MAMMOGRAM  04/28/2023   Fecal DNA (Cologuard)  05/18/2025   INFLUENZA VACCINE  Completed   DEXA SCAN  Completed   Hepatitis C Screening  Completed   HPV VACCINES  Aged Out   COLONOSCOPY (Pts 45-59yrs Insurance coverage will need to be confirmed)  Discontinued    Health Maintenance  Health Maintenance Due  Topic Date Due   Medicare Annual Wellness (AWV)  Never done   Zoster Vaccines- Shingrix (1 of 2) Never done   Pneumonia Vaccine 33+ Years old (1 of 1 - PCV) Never done   DTaP/Tdap/Td (3 - Td or Tdap) 11/26/2022   COVID-19 Vaccine (4 - 2023-24 season) 12/20/2022    Colorectal cancer screening: Type of screening: Colonoscopy. Completed 07/27/2022. Repeat every 3 years  Mammogram status: scheduled for 07/17/2023  Bone Density status: scheduled for 07/17/2023  Lung Cancer Screening: (Low Dose CT Chest recommended if Age 59-80 years, 30 pack-year currently smoking OR have quit w/in 15years.) does not qualify.   Lung Cancer Screening Referral: no   Additional Screening:  Hepatitis C Screening: does qualify; Completed 08/19/2018  Vision Screening: Recommended annual ophthalmology exams for early detection of glaucoma and other disorders of the eye. Is the patient up to date with their annual eye exam?  Yes  Who is the provider or what is the name of the office in which the patient attends annual eye exams? Can't remember name If pt is not established with a provider, would they like to be referred to a provider to establish care? No .   Dental Screening: Recommended annual dental exams for proper oral hygiene  Community Resource Referral / Chronic Care Management: CRR required this visit?  No   CCM required this visit?  No      Plan:     I have personally reviewed and noted the following in the patient's chart:   Medical and social history Use of alcohol, tobacco or illicit drugs  Current medications and supplements  including opioid prescriptions. Patient is not currently taking opioid prescriptions. Functional ability and status Nutritional status Physical activity Advanced directives List of other physicians Hospitalizations, surgeries, and ER visits in previous 12 months Vitals Screenings to include cognitive, depression, and falls Referrals and appointments  In addition, I have reviewed and discussed with patient certain preventive protocols, quality metrics, and best practice recommendations. A written personalized care plan for preventive services as well as general preventive health recommendations were provided to patient.     Kellie Simmering, LPN   [id number]   Nurse Notes: none  Due to this being a virtual visit, the after visit summary with patients personalized plan was offered to patient via mail or my-chart. Patient would like to access on my-chart

## 2023-02-12 NOTE — Patient Instructions (Signed)
Jill Lara , Thank you for taking time to come for your Medicare Wellness Visit. I appreciate your ongoing commitment to your health goals. Please review the following plan we discussed and let me know if I can assist you in the future.   These are the goals we discussed:  Goals      Patient Stated     02/12/2023, wants to lose weight        This is a list of the screening recommended for you and due dates:  Health Maintenance  Topic Date Due   Zoster (Shingles) Vaccine (1 of 2) Never done   Pneumonia Vaccine (1 of 1 - PCV) Never done   DTaP/Tdap/Td vaccine (3 - Td or Tdap) 11/26/2022   COVID-19 Vaccine (4 - 2023-24 season) 12/20/2022   Mammogram  04/28/2023   Medicare Annual Wellness Visit  02/12/2024   Cologuard (Stool DNA test)  05/18/2025   Flu Shot  Completed   DEXA scan (bone density measurement)  Completed   Hepatitis C Screening: USPSTF Recommendation to screen - Ages 18-79 yo.  Completed   HPV Vaccine  Aged Out   Colon Cancer Screening  Discontinued    Advanced directives: Please bring a copy of your POA (Power of Attorney) and/or Living Will to your next appointment.   Conditions/risks identified: none  Next appointment: Follow up in one year for your annual wellness visit    Preventive Care 65 Years and Older, Female Preventive care refers to lifestyle choices and visits with your health care provider that can promote health and wellness. What does preventive care include? A yearly physical exam. This is also called an annual well check. Dental exams once or twice a year. Routine eye exams. Ask your health care provider how often you should have your eyes checked. Personal lifestyle choices, including: Daily care of your teeth and gums. Regular physical activity. Eating a healthy diet. Avoiding tobacco and drug use. Limiting alcohol use. Practicing safe sex. Taking low-dose aspirin every day. Taking vitamin and mineral supplements as recommended by your  health care provider. What happens during an annual well check? The services and screenings done by your health care provider during your annual well check will depend on your age, overall health, lifestyle risk factors, and family history of disease. Counseling  Your health care provider may ask you questions about your: Alcohol use. Tobacco use. Drug use. Emotional well-being. Home and relationship well-being. Sexual activity. Eating habits. History of falls. Memory and ability to understand (cognition). Work and work Statistician. Reproductive health. Screening  You may have the following tests or measurements: Height, weight, and BMI. Blood pressure. Lipid and cholesterol levels. These may be checked every 5 years, or more frequently if you are over 61 years old. Skin check. Lung cancer screening. You may have this screening every year starting at age 16 if you have a 30-pack-year history of smoking and currently smoke or have quit within the past 15 years. Fecal occult blood test (FOBT) of the stool. You may have this test every year starting at age 55. Flexible sigmoidoscopy or colonoscopy. You may have a sigmoidoscopy every 5 years or a colonoscopy every 10 years starting at age 45. Hepatitis C blood test. Hepatitis B blood test. Sexually transmitted disease (STD) testing. Diabetes screening. This is done by checking your blood sugar (glucose) after you have not eaten for a while (fasting). You may have this done every 1-3 years. Bone density scan. This is done to screen for  osteoporosis. You may have this done starting at age 31. Mammogram. This may be done every 1-2 years. Talk to your health care provider about how often you should have regular mammograms. Talk with your health care provider about your test results, treatment options, and if necessary, the need for more tests. Vaccines  Your health care provider may recommend certain vaccines, such as: Influenza vaccine.  This is recommended every year. Tetanus, diphtheria, and acellular pertussis (Tdap, Td) vaccine. You may need a Td booster every 10 years. Zoster vaccine. You may need this after age 82. Pneumococcal 13-valent conjugate (PCV13) vaccine. One dose is recommended after age 69. Pneumococcal polysaccharide (PPSV23) vaccine. One dose is recommended after age 72. Talk to your health care provider about which screenings and vaccines you need and how often you need them. This information is not intended to replace advice given to you by your health care provider. Make sure you discuss any questions you have with your health care provider. Document Released: 12/09/2015 Document Revised: 08/01/2016 Document Reviewed: 09/13/2015 Elsevier Interactive Patient Education  2017 [place] Prevention in the Home Falls can cause injuries. They can happen to people of all ages. There are many things you can do to make your home safe and to help prevent falls. What can I do on the outside of my home? Regularly fix the edges of walkways and driveways and fix any cracks. Remove anything that might make you trip as you walk through a door, such as a raised step or threshold. Trim any bushes or trees on the path to your home. Use bright outdoor lighting. Clear any walking paths of anything that might make someone trip, such as rocks or tools. Regularly check to see if handrails are loose or broken. Make sure that both sides of any steps have handrails. Any raised decks and porches should have guardrails on the edges. Have any leaves, snow, or ice cleared regularly. Use sand or salt on walking paths during winter. Clean up any spills in your garage right away. This includes oil or grease spills. What can I do in the bathroom? Use night lights. Install grab bars by the toilet and in the tub and shower. Do not use towel bars as grab bars. Use non-skid mats or decals in the tub or shower. If you need to sit  down in the shower, use a plastic, non-slip stool. Keep the floor dry. Clean up any water that spills on the floor as soon as it happens. Remove soap buildup in the tub or shower regularly. Attach bath mats securely with double-sided non-slip rug tape. Do not have throw rugs and other things on the floor that can make you trip. What can I do in the bedroom? Use night lights. Make sure that you have a light by your bed that is easy to reach. Do not use any sheets or blankets that are too big for your bed. They should not hang down onto the floor. Have a firm chair that has side arms. You can use this for support while you get dressed. Do not have throw rugs and other things on the floor that can make you trip. What can I do in the kitchen? Clean up any spills right away. Avoid walking on wet floors. Keep items that you use a lot in easy-to-reach places. If you need to reach something above you, use a strong step stool that has a grab bar. Keep electrical cords out of the way. Do not  use floor polish or wax that makes floors slippery. If you must use wax, use non-skid floor wax. Do not have throw rugs and other things on the floor that can make you trip. What can I do with my stairs? Do not leave any items on the stairs. Make sure that there are handrails on both sides of the stairs and use them. Fix handrails that are broken or loose. Make sure that handrails are as long as the stairways. Check any carpeting to make sure that it is firmly attached to the stairs. Fix any carpet that is loose or worn. Avoid having throw rugs at the top or bottom of the stairs. If you do have throw rugs, attach them to the floor with carpet tape. Make sure that you have a light switch at the top of the stairs and the bottom of the stairs. If you do not have them, ask someone to add them for you. What else can I do to help prevent falls? Wear shoes that: Do not have high heels. Have rubber bottoms. Are  comfortable and fit you well. Are closed at the toe. Do not wear sandals. If you use a stepladder: Make sure that it is fully opened. Do not climb a closed stepladder. Make sure that both sides of the stepladder are locked into place. Ask someone to hold it for you, if possible. Clearly mark and make sure that you can see: Any grab bars or handrails. First and last steps. Where the edge of each step is. Use tools that help you move around (mobility aids) if they are needed. These include: Canes. Walkers. Scooters. Crutches. Turn on the lights when you go into a dark area. Replace any light bulbs as soon as they burn out. Set up your furniture so you have a clear path. Avoid moving your furniture around. If any of your floors are uneven, fix them. If there are any pets around you, be aware of where they are. Review your medicines with your doctor. Some medicines can make you feel dizzy. This can increase your chance of falling. Ask your doctor what other things that you can do to help prevent falls. This information is not intended to replace advice given to you by your health care provider. Make sure you discuss any questions you have with your health care provider. Document Released: 09/08/2009 Document Revised: 04/19/2016 Document Reviewed: 12/17/2014 Elsevier Interactive Patient Education  2017 [employer].

## 2023-03-19 DIAGNOSIS — M0579 Rheumatoid arthritis with rheumatoid factor of multiple sites without organ or systems involvement: Secondary | ICD-10-CM | POA: Diagnosis not present

## 2023-05-03 ENCOUNTER — Encounter: Payer: Medicare Other | Admitting: Nurse Practitioner

## 2023-05-03 ENCOUNTER — Encounter (HOSPITAL_BASED_OUTPATIENT_CLINIC_OR_DEPARTMENT_OTHER): Payer: Medicare Other | Admitting: Nurse Practitioner

## 2023-05-24 IMAGING — MG MM DIGITAL SCREENING BILAT W/ TOMO AND CAD
8 series · 8 of 24 positions shown · non-contrast
Comparison: Previous exam(s).

CLINICAL DATA: Screening.

EXAM:
DIGITAL SCREENING BILATERAL MAMMOGRAM WITH TOMOSYNTHESIS AND CAD
TECHNIQUE: Bilateral screening digital craniocaudal and mediolateral oblique
mammograms were obtained. Bilateral screening digital breast
tomosynthesis was performed. The images were evaluated with
computer-aided detection.

[L CC synth-2D]
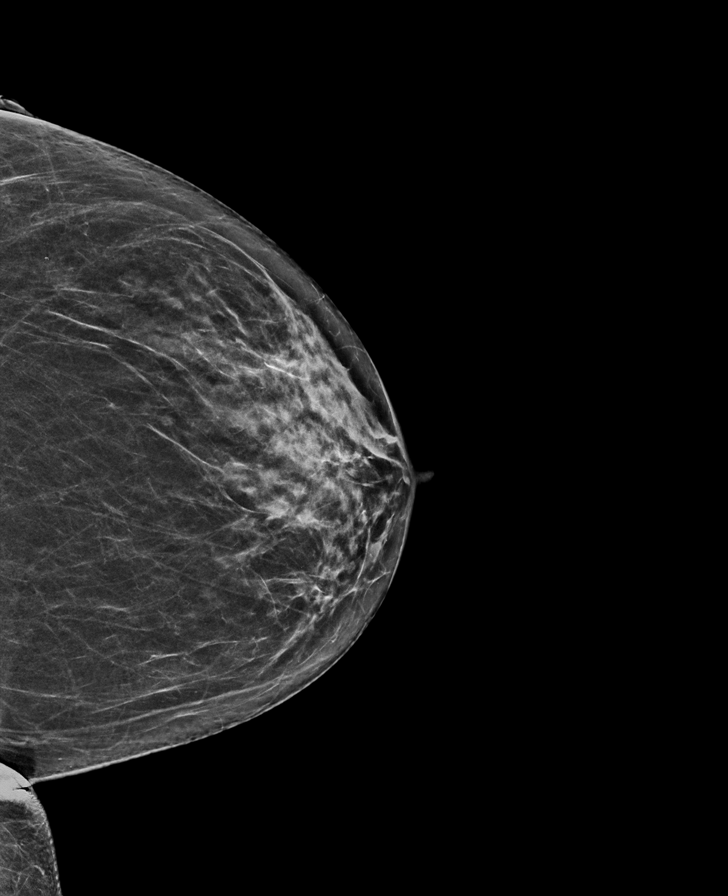

[R CC synth-2D]
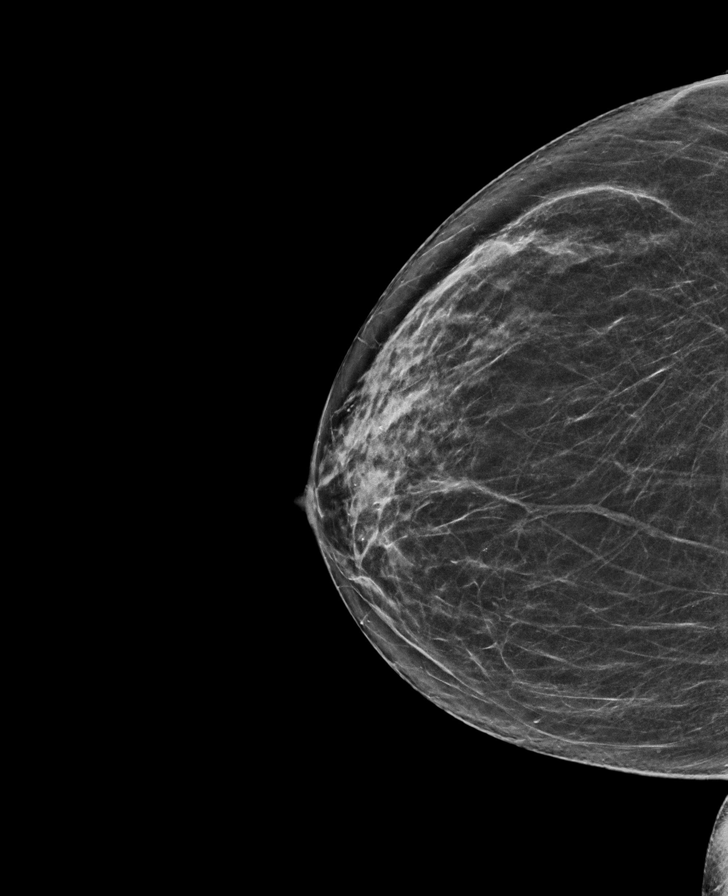

[L MLO synth-2D]
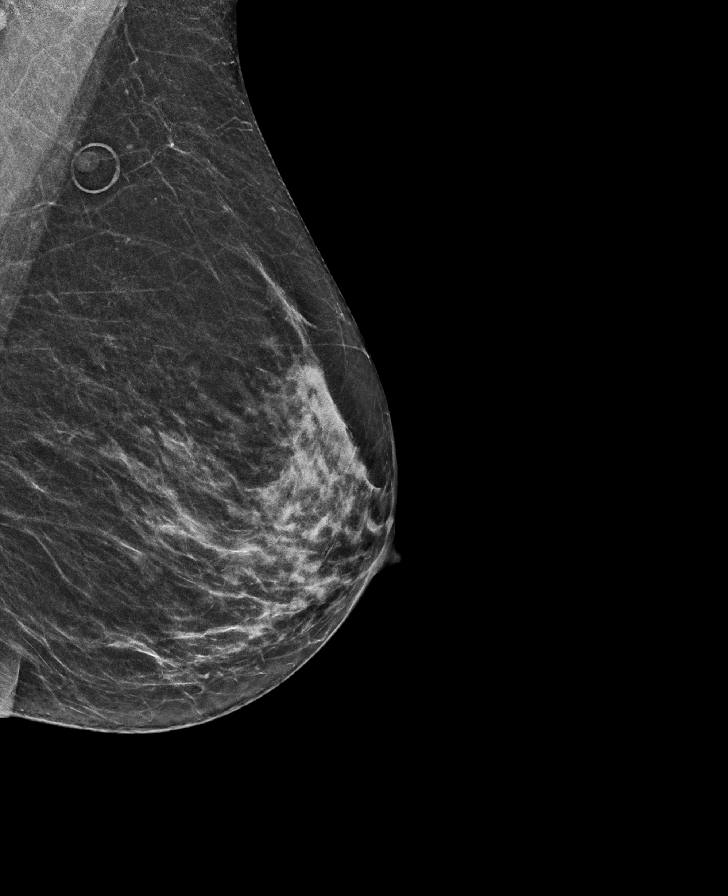

[R MLO synth-2D]
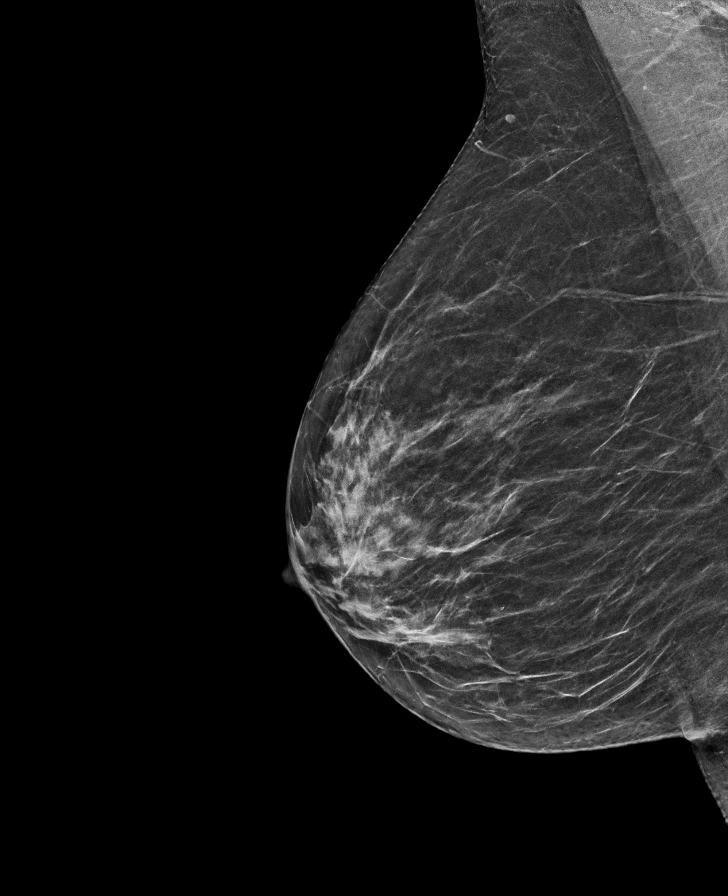

[L CC tomo · tomo slice 31/61.0]
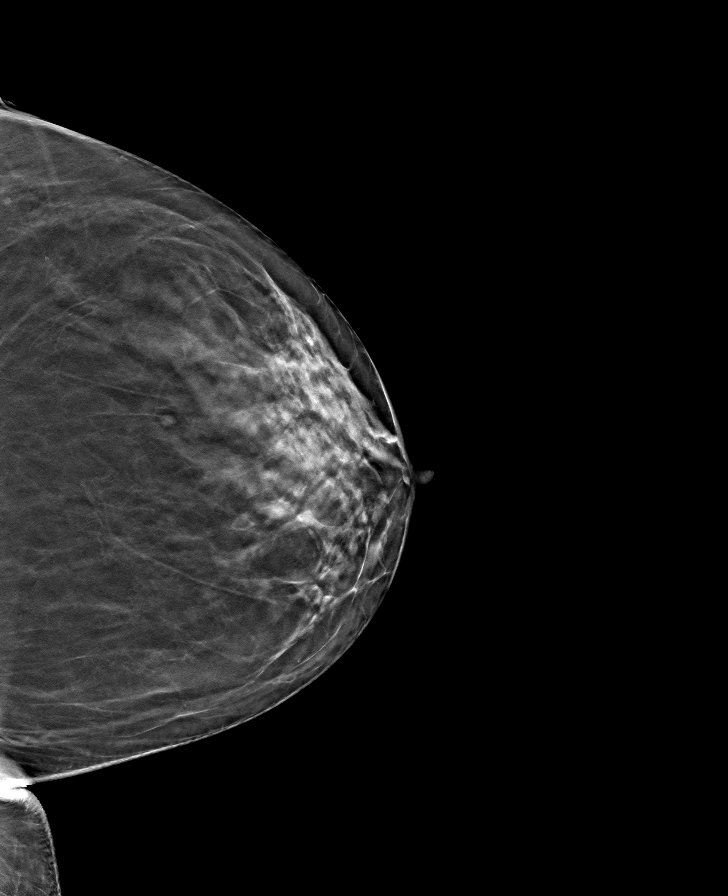

[R CC tomo · tomo slice 31/60.0]
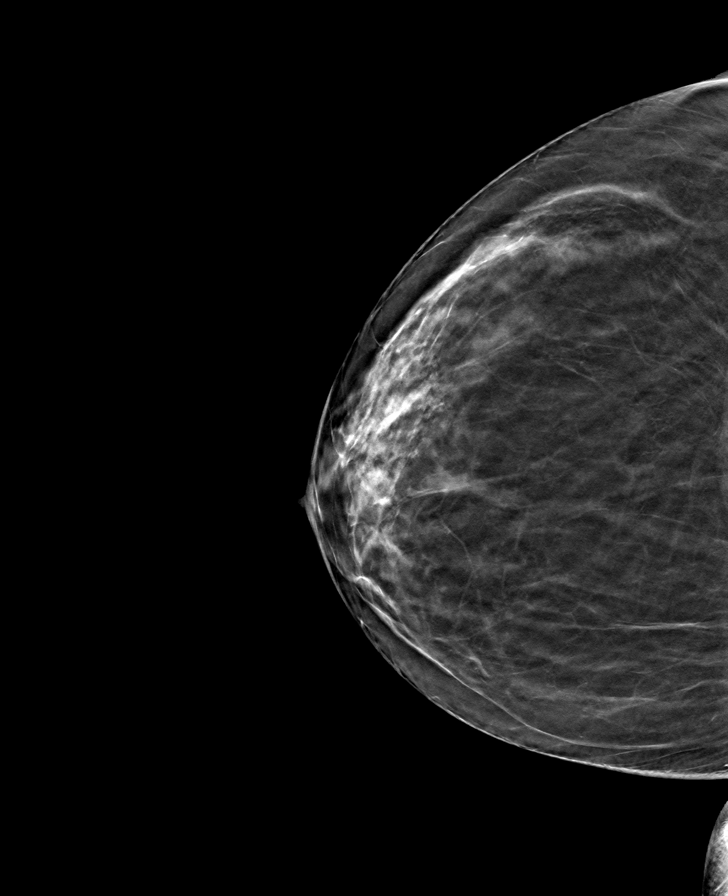

[R MLO tomo · tomo slice 29/58.0]
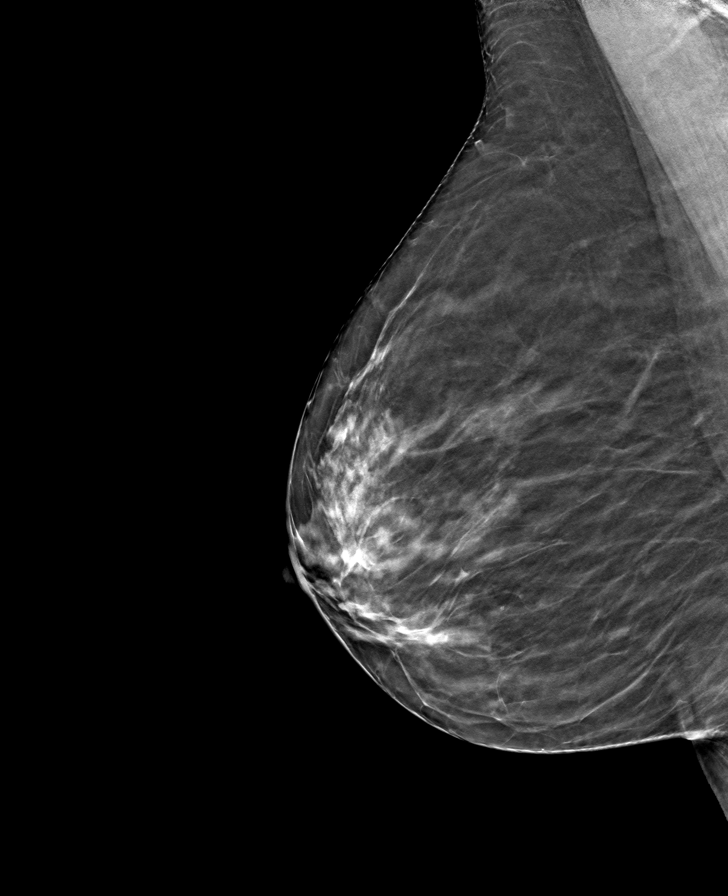

[L MLO tomo · tomo slice 29/58.0]
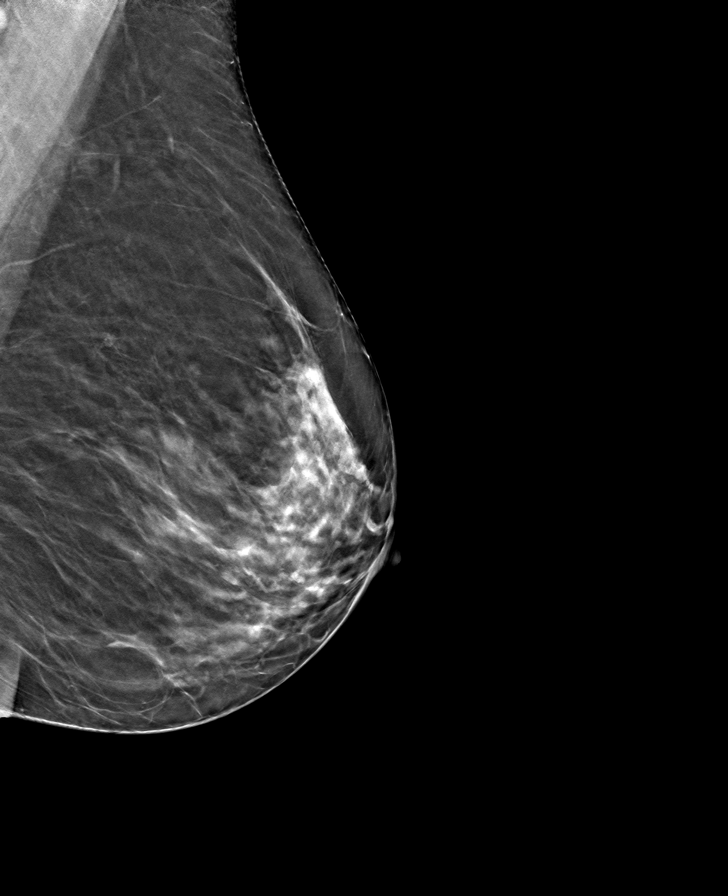

[8 of 24 positions shown; findings below may reference images not displayed]

ACR Breast Density Category c: The breast tissue is heterogeneously
dense, which may obscure small masses.
FINDINGS: There are no findings suspicious for malignancy. The images were
evaluated with computer-aided detection.
IMPRESSION: No mammographic evidence of malignancy. A result letter of this
screening mammogram will be mailed directly to the patient.

RECOMMENDATION:
Screening mammogram in one year. (Code:T4-5-GWO)

BI-RADS CATEGORY  1: Negative.

## 2023-05-27 ENCOUNTER — Ambulatory Visit: Payer: Medicare Other | Admitting: Nurse Practitioner

## 2023-05-27 ENCOUNTER — Encounter: Payer: Self-pay | Admitting: Nurse Practitioner

## 2023-05-27 VITALS — BP 126/86 | HR 64 | Temp 97.7°F | Resp 16 | Ht 64.75 in | Wt 180.0 lb

## 2023-05-27 DIAGNOSIS — D849 Immunodeficiency, unspecified: Secondary | ICD-10-CM | POA: Diagnosis not present

## 2023-05-27 DIAGNOSIS — M810 Age-related osteoporosis without current pathological fracture: Secondary | ICD-10-CM | POA: Diagnosis not present

## 2023-05-27 DIAGNOSIS — Z0000 Encounter for general adult medical examination without abnormal findings: Secondary | ICD-10-CM

## 2023-05-27 DIAGNOSIS — M05732 Rheumatoid arthritis with rheumatoid factor of left wrist without organ or systems involvement: Secondary | ICD-10-CM

## 2023-05-27 DIAGNOSIS — E782 Mixed hyperlipidemia: Secondary | ICD-10-CM

## 2023-05-27 DIAGNOSIS — M05731 Rheumatoid arthritis with rheumatoid factor of right wrist without organ or systems involvement: Secondary | ICD-10-CM

## 2023-05-27 DIAGNOSIS — N951 Menopausal and female climacteric states: Secondary | ICD-10-CM | POA: Diagnosis not present

## 2023-05-27 MED ORDER — ZOSTER VAC RECOMB ADJUVANTED 50 MCG/0.5ML IM SUSR: 0.5000 mL | Freq: Once | INTRAMUSCULAR | 1 refills | Status: AC

## 2023-05-27 NOTE — Progress Notes (Signed)
Shawna Clamp, DNP, AGNP-c [facility] [address] [place], [place] [address] Main Office [phone]  BP 126/86   Pulse 64   Temp 97.7 F (36.5 C) (Oral)   Resp 16   Ht 5' 4.75" (1.645 m)   Wt 180 lb (81.6 kg)   LMP 09/26/2009   SpO2 97% Comment: room air  BMI 30.19 kg/m    Subjective:    Patient ID: Jill Lara, female    DOB: 1955-12-24, 67 y.o.   MRN: [id number]  HPI: Mariaines Sawhill is a 67 y.o. female presenting on 05/27/2023 for comprehensive medical examination.   Current medical concerns include:  Vaginal dryness noted post menopausal  Pertinent items are noted in HPI.  IMMUNIZATIONS:   Flu: Flu vaccine postponed until flu season Prevnar 13: Prevnar 13 N/A for this patient Prevnar 20: Prevnar 20 N/A for this patient Pneumovax 23: Pneumovax 23 N/A for this patient Vac Shingrix: Shingrix declined, patient will complete at a later date or Shingrix N/A for this patient HPV: HPV N/A for this patient Tetanus: Tetanus declined, patient will complete at a later date COVID: COVID completed, documentation in chart   HEALTH MAINTENANCE: She has a bone scan and her mammogram in August Pap Smear HM Status: is up to date Mammogram HM Status: is due and to be scheduled by patient for later completion Colon Cancer Screening HM Status: is up to date Bone Density HM Status: is due and to be scheduled by patient for later completion STI Testing HM Status: was declined  Lung CT HM Status: was declined   She reports regular vision exams q1-5y: Yes  She reports regular dental exams q 73m:  Yes  The patient eats a regular, healthy diet. She endorses exercise and/or activity of: walking as tolerated due to arthritic pain   Most Recent Depression Screen:     02/12/2023    3:46 PM 01/24/2023   10:21 AM 08/29/2022    2:18 PM 05/01/2022   10:37 AM 04/27/2021    9:42 AM  Depression screen PHQ 2/9  Decreased Interest 0 0 0 0 0  Down, Depressed, Hopeless 0 0 0 0 0   PHQ - 2 Score 0 0 0 0 0  Altered sleeping 0  0  0  Tired, decreased energy 0  0  0  Change in appetite 0  1  0  Feeling bad or failure about yourself  0  0  0  Trouble concentrating 0  0  0  Moving slowly or fidgety/restless 0  0  0  Suicidal thoughts 0  0  0  PHQ-9 Score 0  1  0  Difficult doing work/chores Not difficult at all  Not difficult at all     Most Recent Anxiety Screen:     04/27/2021    9:42 AM  GAD 7 : Generalized Anxiety Score  Nervous, Anxious, on Edge 0  Control/stop worrying 0  Worry too much - different things 0  Trouble relaxing 0  Restless 0  Easily annoyed or irritable 0  Afraid - awful might happen 0  Total GAD 7 Score 0   Most Recent Fall Screen:    02/08/2023    4:44 PM 08/29/2022    2:18 PM 05/01/2022   10:36 AM 04/27/2021    9:42 AM  Fall Risk   Falls in the past year? 0 0 0 0  Number falls in past yr: 0 0 0 0  Injury with Fall? 0 0 0 0  Risk for fall due to : Medication side effect No Fall Risks Other (Comment) No Fall Risks  Follow up Falls prevention discussed;Education provided;Falls evaluation completed Falls evaluation completed;Education provided Education provided;Falls evaluation completed Falls evaluation completed    Past medical history, surgical history, medications, allergies, family history and social history reviewed with patient today and changes made to appropriate areas of the chart.  Past Medical History:  Past Medical History:  Diagnosis Date   Allergy    See chart   Arthritis    Encounter to establish care 04/27/2021   Erythema nodosum 1980's   Moderate mixed hyperlipidemia not requiring statin therapy 05/29/2023   Osteopenia of both hips 07/30/2017   Osteoporosis    Rheumatoid aortitis    Dr. Antony Madura in [place]   Shingles 02/2020   face   Medications:  Current Outpatient Medications on File Prior to Visit  Medication Sig   Calcium Citrate-Vitamin D (CALCIUM + D PO) Take by mouth daily.   folic acid (FOLVITE) 800  MCG tablet Take 800 mcg by mouth daily.   HUMIRA PEN 40 MG/0.4ML PNKT SMARTSIG:40 Milligram(s) SUB-Q Every 2 Weeks   methotrexate 2.5 MG tablet Take 10 mg by mouth once a week.   No current facility-administered medications on file prior to visit.   Surgical History:  Past Surgical History:  Procedure Laterality Date   CERVICAL POLYPECTOMY  2003   TONSILLECTOMY  1958   TUBAL LIGATION     BTL   Allergies:  Allergies  Allergen Reactions   Yellow Jacket Venom Anaphylaxis, Hives and Shortness Of Breath   Levaquin [Levofloxacin In D5w] Other (See Comments)    tendonopathy    Macrobid [Nitrofurantoin Macrocrystal] Hives   Ciprofloxacin Rash   Family History:  Family History  Problem Relation Age of Onset   Heart disease Mother    Hyperlipidemia Brother    Heart attack Brother    Heart disease Brother    Colon polyps Brother    Breast cancer Maternal Aunt    Heart disease Maternal Grandmother    Breast cancer Maternal Grandmother    Cancer - Lung Maternal Grandfather    Cancer Maternal Grandfather    Brain cancer Paternal Grandmother    Colon cancer Neg Hx    Esophageal cancer Neg Hx    Stomach cancer Neg Hx    Rectal cancer Neg Hx        Objective:    BP 126/86   Pulse 64   Temp 97.7 F (36.5 C) (Oral)   Resp 16   Ht 5' 4.75" (1.645 m)   Wt 180 lb (81.6 kg)   LMP 09/26/2009   SpO2 97% Comment: room air  BMI 30.19 kg/m   Wt Readings from Last 3 Encounters:  05/27/23 180 lb (81.6 kg)  02/12/23 175 lb (79.4 kg)  01/24/23 176 lb 9.6 oz (80.1 kg)    Physical Exam Vitals and nursing note reviewed.  Constitutional:      General: She is not in acute distress.    Appearance: Normal appearance.  HENT:     Head: Normocephalic and atraumatic.     Right Ear: Hearing, tympanic membrane, ear canal and external ear normal.     Left Ear: Hearing, tympanic membrane, ear canal and external ear normal.     Nose: Nose normal.     Right Sinus: No maxillary sinus  tenderness or frontal sinus tenderness.     Left Sinus: No maxillary sinus tenderness or frontal sinus tenderness.  Mouth/Throat:     Lips: Pink.     Mouth: Mucous membranes are moist.     Pharynx: Oropharynx is clear.  Eyes:     General: Lids are normal. Vision grossly intact.     Extraocular Movements: Extraocular movements intact.     Conjunctiva/sclera: Conjunctivae normal.     Pupils: Pupils are equal, round, and reactive to light.     Funduscopic exam:    Right eye: Red reflex present.        Left eye: Red reflex present.    Visual Fields: Right eye visual fields normal and left eye visual fields normal.  Neck:     Thyroid: No thyromegaly.     Vascular: No carotid bruit.  Cardiovascular:     Rate and Rhythm: Normal rate and regular rhythm.     Chest Wall: PMI is not displaced.     Pulses: Normal pulses.          Dorsalis pedis pulses are 2+ on the right side and 2+ on the left side.       Posterior tibial pulses are 2+ on the right side and 2+ on the left side.     Heart sounds: Normal heart sounds. No murmur heard. Pulmonary:     Effort: Pulmonary effort is normal. No respiratory distress.     Breath sounds: Normal breath sounds.  Abdominal:     General: Abdomen is flat. Bowel sounds are normal. There is no distension.     Palpations: Abdomen is soft. There is no hepatomegaly, splenomegaly or mass.     Tenderness: There is no abdominal tenderness. There is no right CVA tenderness, left CVA tenderness, guarding or rebound.  Musculoskeletal:        General: Normal range of motion.     Cervical back: Full passive range of motion without pain, normal range of motion and neck supple. No tenderness.     Right lower leg: No edema.     Left lower leg: No edema.  Feet:     Left foot:     Toenail Condition: Left toenails are normal.  Lymphadenopathy:     Cervical: No cervical adenopathy.     Upper Body:     Right upper body: No supraclavicular adenopathy.     Left upper  body: No supraclavicular adenopathy.  Skin:    General: Skin is warm and dry.     Capillary Refill: Capillary refill takes less than 2 seconds.     Nails: There is no clubbing.  Neurological:     General: No focal deficit present.     Mental Status: She is alert and oriented to person, place, and time.     GCS: GCS eye subscore is 4. GCS verbal subscore is 5. GCS motor subscore is 6.     Sensory: Sensation is intact.     Motor: Motor function is intact.     Coordination: Coordination is intact.     Gait: Gait is intact.     Deep Tendon Reflexes: Reflexes are normal and symmetric.  Psychiatric:        Attention and Perception: Attention normal.        Mood and Affect: Mood normal.        Speech: Speech normal.        Behavior: Behavior normal. Behavior is cooperative.        Thought Content: Thought content normal.        Cognition and Memory: Cognition and memory normal.  Judgment: Judgment normal.     Results for orders placed or performed in visit on 05/27/23  CBC with Differential/Platelet  Result Value Ref Range   WBC 5.5 3.4 - 10.8 x10E3/uL   RBC 4.38 3.77 - 5.28 x10E6/uL   Hemoglobin 13.3 11.1 - 15.9 g/dL   Hematocrit 75.6 43.3 - 46.6 %   MCV 90 79 - 97 fL   MCH 30.4 26.6 - 33.0 pg   MCHC 33.7 31.5 - 35.7 g/dL   RDW 29.5 18.8 - 41.6 %   Platelets 305 150 - 450 x10E3/uL   Neutrophils 42 Not Estab. %   Lymphs 44 Not Estab. %   Monocytes 8 Not Estab. %   Eos 5 Not Estab. %   Basos 1 Not Estab. %   Neutrophils Absolute 2.3 1.4 - 7.0 x10E3/uL   Lymphocytes Absolute 2.5 0.7 - 3.1 x10E3/uL   Monocytes Absolute 0.4 0.1 - 0.9 x10E3/uL   EOS (ABSOLUTE) 0.3 0.0 - 0.4 x10E3/uL   Basophils Absolute 0.0 0.0 - 0.2 x10E3/uL   Immature Granulocytes 0 Not Estab. %   Immature Grans (Abs) 0.0 0.0 - 0.1 x10E3/uL  CMP14+EGFR  Result Value Ref Range   Glucose 85 70 - 99 mg/dL   BUN 14 8 - 27 mg/dL   Creatinine, Ser 6.06 0.57 - 1.00 mg/dL   eGFR 77 >30 ZS/WFU/9.32    BUN/Creatinine Ratio 17 12 - 28   Sodium 140 134 - 144 mmol/L   Potassium 4.2 3.5 - 5.2 mmol/L   Chloride 102 96 - 106 mmol/L   CO2 26 20 - 29 mmol/L   Calcium 9.8 8.7 - 10.3 mg/dL   Total Protein 6.8 6.0 - 8.5 g/dL   Albumin 4.6 3.9 - 4.9 g/dL   Globulin, Total 2.2 1.5 - 4.5 g/dL   Bilirubin Total 0.7 0.0 - 1.2 mg/dL   Alkaline Phosphatase 68 44 - 121 IU/L   AST 21 0 - 40 IU/L   ALT 18 0 - 32 IU/L  Lipid panel  Result Value Ref Range   Cholesterol, Total 214 (H) 100 - 199 mg/dL   Triglycerides 355 0 - 149 mg/dL   HDL 58 >73 mg/dL   VLDL Cholesterol Cal 21 5 - 40 mg/dL   LDL Chol Calc (NIH) 220 (H) 0 - 99 mg/dL   Chol/HDL Ratio 3.7 0.0 - 4.4 ratio         Assessment & Plan:   Problem List Items Addressed This Visit     Rheumatoid arthritis (HCC)    Currently managed with humira and methotrexate. No alarm symptoms are present at this time. No significant swelling or deformity. We will plan for her vaccines to ensure that her medications can be held prior to receiving these.       Relevant Orders   CBC with Differential/Platelet (Completed)   CMP14+EGFR (Completed)   Lipid panel (Completed)   Age-related osteoporosis without current pathological fracture    DEXA scheduled for August. No alarm symptoms present.       Relevant Orders   CBC with Differential/Platelet (Completed)   CMP14+EGFR (Completed)   Lipid panel (Completed)   Encounter for annual physical exam - Primary    CPE completed today.   Labs ordered. Will make changes as necessary based on results.  Review of HM activities and recommendations discussed and provided on AVS. Anticipatory guidance, diet, and exercise recommendations provided.  Medications, allergies, and hx reviewed and updated as necessary.  Plan to f/u with CPE in  1 year or sooner for acute/chronic health needs as directed.        Relevant Orders   CBC with Differential/Platelet (Completed)   CMP14+EGFR (Completed)   Lipid panel  (Completed)   Vaginal dryness, menopausal    Discussion of post menopausal vaginal dryness. Recommend trial of aquaphor topically to vaginal introitus to help with improved moisture.      Moderate mixed hyperlipidemia not requiring statin therapy    Labs pending for monitoring.       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 No alarm symptoms present at this time. Will monitor closely.           Follow up plan: Return in about 1 year (around 05/26/2024) for AWV + CPE.  NEXT PREVENTATIVE PHYSICAL DUE IN 1 YEAR.  PATIENT COUNSELING PROVIDED FOR ALL ADULT PATIENTS: A well balanced diet low in saturated fats, cholesterol, and moderation in carbohydrates.  This can be as simple as monitoring portion sizes and cutting back on sugary beverages such as soda and juice to start with.    Daily water consumption of at least 64 ounces.  Physical activity at least 180 minutes per week.  If just starting out, start 10 minutes a day and work your way up.   This can be as simple as taking the stairs instead of the elevator and walking 2-3 laps around the office  purposefully every day.   STD protection, partner selection, and regular testing if high risk.  Limited consumption of alcoholic beverages if alcohol is consumed. For men, I recommend no more than 14 alcoholic beverages per week, spread out throughout the week (max 2 per day). Avoid "binge" drinking or consuming large quantities of alcohol in one setting.  Please let me know if you feel you may need help with reduction or quitting alcohol consumption.   Avoidance of nicotine, if used. Please let me know if you feel you may need help with reduction or quitting nicotine use.   Daily mental health attention. This can be in the form of 5 minute daily meditation, prayer, journaling, yoga, reflection, etc.  Purposeful attention to your emotions and mental state can significantly improve your overall wellbeing   and  Health.  Please know that I am here to help you with all of your health care goals and am happy to work with you to find a solution that works best for you.  The greatest advice I have received with any changes in life are to take it one step at a time, that even means if all you can focus on is the next 60 seconds, then do that and celebrate your victories.  With any changes in life, you will have set backs, and that is OK. The important thing to remember is, if you have a set back, it is not a failure, it is an opportunity to try again! Screening Testing Mammogram Every 1 -2 years based on history and risk factors Starting at age 69 Pap Smear Ages 21-39 every 3 years Ages 40-65 every 5 years with HPV testing More frequent testing may be required based on results and history Colon Cancer Screening Every 1-10 years based on test performed, risk factors, and history Starting at age 86 Bone Density Screening Every 2-10 years based on history Starting at age 21 for women Recommendations for men differ based on medication usage, history, and risk factors AAA Screening One time ultrasound Men 83-73 years old who have every smoked  Lung Cancer Screening Low Dose Lung CT every 12 months Age 78-80 years with a 30 pack-year smoking history who still smoke or who have quit within the last 15 years   Screening Labs Routine  Labs: Complete Blood Count (CBC), Complete Metabolic Panel (CMP), Cholesterol (Lipid Panel) Every 6-12 months based on history and medications May be recommended more frequently based on current conditions or previous results Hemoglobin A1c Lab Every 3-12 months based on history and previous results Starting at age 23 or earlier with diagnosis of diabetes, high cholesterol, BMI >26, and/or risk factors Frequent monitoring for patients with diabetes to ensure blood sugar control Thyroid Panel (TSH) Every 6 months based on history, symptoms, and risk factors May be  repeated more often if on medication HIV One time testing for all patients 71 and older May be repeated more frequently for patients with increased risk factors or exposure Hepatitis C One time testing for all patients 86 and older May be repeated more frequently for patients with increased risk factors or exposure Gonorrhea, Chlamydia Every 12 months for all sexually active persons 13-24 years Additional monitoring may be recommended for those who are considered high risk or who have symptoms Every 12 months for any woman on birth control, regardless of sexual activity PSA Men 52-37 years old with risk factors Additional screening may be recommended from age 26-69 based on risk factors, symptoms, and history  Vaccine Recommendations Tetanus Booster All adults every 10 years Flu Vaccine All patients 6 months and older every year COVID Vaccine All patients 12 years and older Initial dosing with booster May recommend additional booster based on age and health history HPV Vaccine 2 doses all patients age 35-26 Dosing may be considered for patients over 26 Shingles Vaccine (Shingrix) 2 doses all adults 55 years and older Pneumonia (Pneumovax 23) All adults 65 years and older May recommend earlier dosing based on health history One year apart from Prevnar 13 Pneumonia (Prevnar 62) All adults 65 years and older Dosed 1 year after Pneumovax 23 Pneumonia (Prevnar 20) One time alternative to the two dosing of 13 and 23 For all adults with initial dose of 23, 20 is recommended 1 year later For all adults with initial dose of 13, 23 is still recommended as second option 1 year later

## 2023-05-27 NOTE — Patient Instructions (Addendum)
I recommend trying Aquaphor to see if this helps with the dryness. This can be purchased over the counter at the pharmacy. Look for it in the lotion area.   I will let you know what the labs show. If we need to change anything I will be in touch with a  plan.

## 2023-05-28 LAB — CBC WITH DIFFERENTIAL/PLATELET
Basophils Absolute: 0 10*3/uL (ref 0.0–0.2)
Basos: 1 %
EOS (ABSOLUTE): 0.3 10*3/uL (ref 0.0–0.4)
Eos: 5 %
Hematocrit: 39.5 % (ref 34.0–46.6)
Hemoglobin: 13.3 g/dL (ref 11.1–15.9)
Immature Grans (Abs): 0 10*3/uL (ref 0.0–0.1)
Immature Granulocytes: 0 %
Lymphocytes Absolute: 2.5 10*3/uL (ref 0.7–3.1)
Lymphs: 44 %
MCH: 30.4 pg (ref 26.6–33.0)
MCHC: 33.7 g/dL (ref 31.5–35.7)
MCV: 90 fL (ref 79–97)
Monocytes Absolute: 0.4 10*3/uL (ref 0.1–0.9)
Monocytes: 8 %
Neutrophils Absolute: 2.3 10*3/uL (ref 1.4–7.0)
Neutrophils: 42 %
Platelets: 305 10*3/uL (ref 150–450)
RBC: 4.38 x10E6/uL (ref 3.77–5.28)
RDW: 12.5 % (ref 11.7–15.4)
WBC: 5.5 10*3/uL (ref 3.4–10.8)

## 2023-05-28 LAB — CMP14+EGFR
ALT: 18 IU/L (ref 0–32)
AST: 21 IU/L (ref 0–40)
Albumin: 4.6 g/dL (ref 3.9–4.9)
Alkaline Phosphatase: 68 IU/L (ref 44–121)
BUN/Creatinine Ratio: 17 (ref 12–28)
BUN: 14 mg/dL (ref 8–27)
Bilirubin Total: 0.7 mg/dL (ref 0.0–1.2)
CO2: 26 mmol/L (ref 20–29)
Calcium: 9.8 mg/dL (ref 8.7–10.3)
Chloride: 102 mmol/L (ref 96–106)
Creatinine, Ser: 0.83 mg/dL (ref 0.57–1.00)
Globulin, Total: 2.2 g/dL (ref 1.5–4.5)
Glucose: 85 mg/dL (ref 70–99)
Potassium: 4.2 mmol/L (ref 3.5–5.2)
Sodium: 140 mmol/L (ref 134–144)
Total Protein: 6.8 g/dL (ref 6.0–8.5)
eGFR: 77 mL/min/{1.73_m2} (ref >59)

## 2023-05-28 LAB — LIPID PANEL
Chol/HDL Ratio: 3.7 ratio (ref 0.0–4.4)
Cholesterol, Total: 214 mg/dL — ABNORMAL HIGH (ref 100–199)
HDL: 58 mg/dL (ref >39)
LDL Chol Calc (NIH): 135 mg/dL — ABNORMAL HIGH (ref 0–99)
Triglycerides: 121 mg/dL (ref 0–149)
VLDL Cholesterol Cal: 21 mg/dL (ref 5–40)

## 2023-05-29 ENCOUNTER — Encounter: Payer: Self-pay | Admitting: Nurse Practitioner

## 2023-05-29 ENCOUNTER — Other Ambulatory Visit: Payer: Self-pay | Admitting: Nurse Practitioner

## 2023-05-29 DIAGNOSIS — E782 Mixed hyperlipidemia: Secondary | ICD-10-CM

## 2023-05-29 HISTORY — DX: Mixed hyperlipidemia: E78.2

## 2023-06-13 DIAGNOSIS — M810 Age-related osteoporosis without current pathological fracture: Secondary | ICD-10-CM | POA: Diagnosis not present

## 2023-06-13 DIAGNOSIS — M0579 Rheumatoid arthritis with rheumatoid factor of multiple sites without organ or systems involvement: Secondary | ICD-10-CM | POA: Diagnosis not present

## 2023-06-13 DIAGNOSIS — R7989 Other specified abnormal findings of blood chemistry: Secondary | ICD-10-CM | POA: Diagnosis not present

## 2023-06-13 DIAGNOSIS — M25551 Pain in right hip: Secondary | ICD-10-CM | POA: Diagnosis not present

## 2023-06-13 DIAGNOSIS — M1991 Primary osteoarthritis, unspecified site: Secondary | ICD-10-CM | POA: Diagnosis not present

## 2023-06-30 ENCOUNTER — Encounter: Payer: Self-pay | Admitting: Nurse Practitioner

## 2023-06-30 DIAGNOSIS — Z6830 Body mass index (BMI) 30.0-30.9, adult: Secondary | ICD-10-CM | POA: Insufficient documentation

## 2023-06-30 DIAGNOSIS — D849 Immunodeficiency, unspecified: Secondary | ICD-10-CM | POA: Insufficient documentation

## 2023-06-30 NOTE — Assessment & Plan Note (Signed)
CPE completed today.   Labs ordered. Will make changes as necessary based on results.  Review of HM activities and recommendations discussed and provided on AVS Anticipatory guidance, diet, and exercise recommendations provided.  Medications, allergies, and hx reviewed and updated as necessary.  Plan to f/u with CPE in 1 year or sooner for acute/chronic health needs as directed.   

## 2023-06-30 NOTE — Assessment & Plan Note (Signed)
Discussion of post menopausal vaginal dryness. Recommend trial of aquaphor topically to vaginal introitus to help with improved moisture.

## 2023-06-30 NOTE — Assessment & Plan Note (Signed)
Labs pending for monitoring.

## 2023-06-30 NOTE — Assessment & Plan Note (Signed)
Chronic related to methotrexate and humira. No alarm symptoms present at this time. Will monitor closely.

## 2023-06-30 NOTE — Assessment & Plan Note (Addendum)
Currently managed with humira and methotrexate. No alarm symptoms are present at this time. No significant swelling or deformity. We will plan for her vaccines to ensure that her medications can be held prior to receiving these.

## 2023-06-30 NOTE — Assessment & Plan Note (Signed)
DEXA scheduled for August. No alarm symptoms present.

## 2023-07-17 ENCOUNTER — Ambulatory Visit
Admission: RE | Admit: 2023-07-17 | Discharge: 2023-07-17 | Disposition: A | Discharge status: Home / Self Care | Payer: Medicare Other | Source: Ambulatory Visit | Attending: Obstetrics & Gynecology | Admitting: Obstetrics & Gynecology

## 2023-07-17 ENCOUNTER — Ambulatory Visit: Admission: RE | Admit: 2023-07-17 | Payer: Medicare Other | Source: Ambulatory Visit

## 2023-07-17 DIAGNOSIS — M069 Rheumatoid arthritis, unspecified: Secondary | ICD-10-CM | POA: Diagnosis not present

## 2023-07-17 DIAGNOSIS — E349 Endocrine disorder, unspecified: Secondary | ICD-10-CM | POA: Diagnosis not present

## 2023-07-17 DIAGNOSIS — M8588 Other specified disorders of bone density and structure, other site: Secondary | ICD-10-CM | POA: Diagnosis not present

## 2023-07-17 DIAGNOSIS — Z1231 Encounter for screening mammogram for malignant neoplasm of breast: Secondary | ICD-10-CM

## 2023-07-17 DIAGNOSIS — M810 Age-related osteoporosis without current pathological fracture: Secondary | ICD-10-CM

## 2023-08-20 ENCOUNTER — Ambulatory Visit (HOSPITAL_BASED_OUTPATIENT_CLINIC_OR_DEPARTMENT_OTHER): Payer: Medicare Other | Admitting: Obstetrics & Gynecology

## 2023-08-20 ENCOUNTER — Encounter (HOSPITAL_BASED_OUTPATIENT_CLINIC_OR_DEPARTMENT_OTHER): Payer: Self-pay | Admitting: Obstetrics & Gynecology

## 2023-08-20 VITALS — BP 124/55 | HR 67 | Ht 64.5 in | Wt 183.0 lb

## 2023-08-20 DIAGNOSIS — M810 Age-related osteoporosis without current pathological fracture: Secondary | ICD-10-CM

## 2023-08-20 MED ORDER — RISEDRONATE SODIUM 5 MG PO TABS: 5.0000 mg | ORAL_TABLET | Freq: Every day | ORAL | 3 refills | Status: DC

## 2023-08-20 NOTE — Progress Notes (Signed)
Subjective:    64 yrs Married Caucasian [id number]  female here to discuss recent BMD obtained showing osteoporosis in her spine that has worsened over last two bone density testing.  T score -3.5 present in spine in BMD obtained 07/17/2023.     Osteoporosis Risk Factors  Personal Hx of fracture as an adult: no Hx of fracture in first-degree relative: mother with hip fracture Tobacco use: no Low body weight (<127 lbs): no Low calcium intake (lifelong): yes Alcohol use more than 2 drinks per day: no Recurrent falls: no Inadequate physical activity: active and does 30 minutes of exercise 3 times weekly  Past Medical History:  Diagnosis Date   Allergy    See chart   Arthritis    Encounter to establish care 04/27/2021   Erythema nodosum 1980's   Moderate mixed hyperlipidemia not requiring statin therapy 05/29/2023   Osteopenia of both hips 07/30/2017   Osteoporosis    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     folic acid (FOLVITE) 800 MCG tablet Take 800 mcg by mouth daily.     HUMIRA PEN 40 MG/0.4ML PNKT SMARTSIG:40 Milligram(s) SUB-Q Every 2 Weeks     methotrexate 2.5 MG tablet Take 10 mg by mouth once a week.     No current facility-administered medications on file prior to visit.   Allergies  Allergen Reactions   Yellow Jacket Venom Anaphylaxis, Hives and Shortness Of Breath   Levaquin [Levofloxacin In D5w] Other (See Comments)    tendonopathy    Macrobid [Nitrofurantoin Macrocrystal] Hives   Ciprofloxacin Rash    Review of Systems Pertinent items noted in HPI and remainder of comprehensive ROS otherwise negative.     Objective:   PHYSICAL EXAM BP (!) 124/55 (BP Location: Right Arm, Patient Position: Sitting, Cuff Size: Large)   Pulse 67   Ht 5' 4.5" (1.638 m)   Wt 183 lb (83 kg)    LMP 09/26/2009   BMI 30.93 kg/m  General appearance: alert and no distress  Imaging Bone Density: Spine T Score: -3.5, Hip T Score: -2.5   Done on 8/23                                   Assessment:   Osteoporosis with T score of -3.5 in spine, -2.5 hip   Plan:   1.  Treatment options with medications discussed including:   Bisphosphonates po and IV  Evist  Prolia subcutaneous   Forteo subcutaneous  Estrogen/progesterone therapy  Risks, benefits of medications, mechanism of action discussed.  Specifically atypical fractures and osteonecrosis discussed.  She decided to start actonel 5mg  daily.  Dosing and administration discussed.  Rx to pharmacy  2.  Patient counseled in adequate calcium and vitamin D.   3.  30 minutes of weight bearing exercise at least 3 times weekly recommended.  4.  Fall prevention discussed.  5.  Lab work ordered:  Vit D and PTH/Calcium  6.  Repeat BMD 2 years.

## 2023-08-21 LAB — PTH, INTACT AND CALCIUM
Calcium: 9.7 mg/dL (ref 8.7–10.3)
PTH: 23 pg/mL (ref 15–65)

## 2023-08-21 LAB — VITAMIN D 25 HYDROXY (VIT D DEFICIENCY, FRACTURES): Vit D, 25-Hydroxy: 73 ng/mL (ref 30.0–100.0)

## 2023-09-26 DIAGNOSIS — M0579 Rheumatoid arthritis with rheumatoid factor of multiple sites without organ or systems involvement: Secondary | ICD-10-CM | POA: Diagnosis not present

## 2023-10-28 ENCOUNTER — Ambulatory Visit (INDEPENDENT_AMBULATORY_CARE_PROVIDER_SITE_OTHER): Payer: Medicare Other | Admitting: Medical

## 2023-10-28 VITALS — BP 110/70 | HR 72 | Temp 97.2°F | Wt 179.2 lb

## 2023-10-28 DIAGNOSIS — R051 Acute cough: Secondary | ICD-10-CM

## 2023-10-28 DIAGNOSIS — R0982 Postnasal drip: Secondary | ICD-10-CM | POA: Diagnosis not present

## 2023-10-28 DIAGNOSIS — Z79899 Other long term (current) drug therapy: Secondary | ICD-10-CM

## 2023-10-28 MED ORDER — PSEUDOEPHEDRINE HCL 60 MG PO TABS: 60.0000 mg | ORAL_TABLET | Freq: Three times a day (TID) | ORAL | 0 refills | Status: DC | PRN

## 2023-10-28 MED ORDER — BENZONATATE 200 MG PO CAPS: 200.0000 mg | ORAL_CAPSULE | Freq: Three times a day (TID) | ORAL | 0 refills | Status: DC | PRN

## 2023-10-28 MED ORDER — AMOXICILLIN 875 MG PO TABS: 875.0000 mg | ORAL_TABLET | Freq: Two times a day (BID) | ORAL | 0 refills | Status: AC

## 2023-10-28 NOTE — Progress Notes (Signed)
Subjective:  Jill Lara is a 67 y.o. female who presents for Chief Complaint  Patient presents with   Cough    Cough since last Sunday night over a week ago. Congestion. Taking mucinex to help get something to come up. Wheezing. No fever, no chills, no body aches     Here for cough.   Started a week ago with cough, congestion, worse in evenings. No sinus pressure, no ear pain, no significant headache.   No sore throat, but has hoarse voice.   Not sleeping well due to cough.   No fever, no body ache or chills. No SOB or wheezing.  Getting some colored mucous with cough.  No sick contacts.     Using mucinex and cough suppressant for the past week.  Had covid in October.  Took 2 weeks to resolve then for mild illness.  That was her first episode of covid.    Nonsmoker, no hx/o asthma or lung disease  No other aggravating or relieving factors.    No other c/o.  Past Medical History:  Diagnosis Date   Allergy    See chart   Arthritis    Encounter to establish care 04/27/2021   Erythema nodosum 1980's   Moderate mixed hyperlipidemia not requiring statin therapy 05/29/2023   Osteopenia of both hips 07/30/2017   Osteoporosis    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     folic acid (FOLVITE) 800 MCG tablet Take 800 mcg by mouth daily.     HUMIRA PEN 40 MG/0.4ML PNKT SMARTSIG:40 Milligram(s) SUB-Q Every 2 Weeks     methotrexate 2.5 MG tablet Take 10 mg by mouth once a week.     risedronate (ACTONEL) 5 MG tablet Take 1 tablet (5 mg total) by mouth daily before breakfast. with water on empty stomach, nothing by mouth or lie down for next 30 minutes. 90 tablet 3   No current facility-administered medications on file prior to visit.    The following portions of the patient's history were reviewed and  updated as appropriate: allergies, current medications, past family history, past medical history, past social history, past surgical history and problem list.  ROS Otherwise as in subjective above    Objective: BP 110/70   Pulse 72   Temp (!) 97.2 F (36.2 C)   Wt 179 lb 3.2 oz (81.3 kg)   LMP 09/26/2009   SpO2 98%   BMI 30.28 kg/m   General appearance: alert, no distress, well developed, well nourished HEENT: normocephalic, sclerae anicteric, conjunctiva pink and moist, TMs pearly, nares patent, no discharge or erythema, pharynx with post nasal drainage and some erythema Oral cavity: MMM, no lesions Neck: supple, no lymphadenopathy, no thyromegaly, no masses Lungs: CTA bilaterally, no wheezes, rhonchi, or rales   Assessment: Encounter Diagnoses  Name Primary?   Acute cough Yes   Post-nasal drainage    High risk medication use      Plan: We discussed symptoms and exam findings.  Lungs are clear.  Reassured no concern for pneumonia.  Consider changing her to [facility] for a few days instead of Mucinex, Tessalon as needed for cough.  Add nasal saline and salt water gargles.  Advise good hydration.  If not much improvement in the next 48 hours then begin amoxicillin.  Discussed usual timeframe to see improvements.   Jill Lara was seen today for cough.  Diagnoses and all orders for this visit:  Acute cough  Post-nasal drainage  High risk medication use  Other orders -     amoxicillin (AMOXIL) 875 MG tablet; Take 1 tablet (875 mg total) by mouth 2 (two) times daily for 10 days. -     pseudoephedrine (SUDAFED) 60 MG tablet; Take 1 tablet (60 mg total) by mouth every 8 (eight) hours as needed for congestion. -     benzonatate (TESSALON) 200 MG capsule; Take 1 capsule (200 mg total) by mouth 3 (three) times daily as needed for cough.    Follow up: prn

## 2023-12-12 DIAGNOSIS — M1991 Primary osteoarthritis, unspecified site: Secondary | ICD-10-CM | POA: Diagnosis not present

## 2023-12-12 DIAGNOSIS — R7989 Other specified abnormal findings of blood chemistry: Secondary | ICD-10-CM | POA: Diagnosis not present

## 2023-12-12 DIAGNOSIS — M810 Age-related osteoporosis without current pathological fracture: Secondary | ICD-10-CM | POA: Diagnosis not present

## 2023-12-12 DIAGNOSIS — M25551 Pain in right hip: Secondary | ICD-10-CM | POA: Diagnosis not present

## 2023-12-12 DIAGNOSIS — M0579 Rheumatoid arthritis with rheumatoid factor of multiple sites without organ or systems involvement: Secondary | ICD-10-CM | POA: Diagnosis not present

## 2024-02-18 ENCOUNTER — Ambulatory Visit: Payer: Medicare Other

## 2024-02-18 DIAGNOSIS — Z0000 Encounter for general adult medical examination without abnormal findings: Secondary | ICD-10-CM | POA: Diagnosis not present

## 2024-02-18 NOTE — Progress Notes (Signed)
 Subjective:   Jill Lara is a 68 y.o. who presents for a Medicare Wellness preventive visit.  Visit Complete: Virtual I connected with  Jill Lara on 02/18/24 by a audio enabled telemedicine application and verified that I am speaking with the correct person using two identifiers.  Patient Location: Home  Provider Location: Office/Clinic  I discussed the limitations of evaluation and management by telemedicine. The patient expressed understanding and agreed to proceed.  Vital Signs: Because this visit was a virtual/telehealth visit, some criteria may be missing or patient reported. Any vitals not documented were not able to be obtained and vitals that have been documented are patient reported.  VideoError- Librarian, academic were attempted between this provider and patient, however failed, due to patient having technical difficulties OR patient did not have access to video capability.  We continued and completed visit with audio only.   Persons Participating in Visit: Patient.  AWV Questionnaire: Yes: Patient Medicare AWV questionnaire was completed by the patient on 02/17/2024; I have confirmed that all information answered by patient is correct and no changes since this date.  Cardiac Risk Factors include: advanced age (>52men, >49 women)     Objective:    Today's Vitals   There is no height or weight on file to calculate BMI.     02/18/2024    4:11 PM 02/12/2023    3:45 PM 07/23/2018    8:02 AM  Advanced Directives  Does Patient Have a Medical Advance Directive? Yes Yes No  Type of Estate agent of [place];Living will Healthcare Power of [place];Living will   Copy of Healthcare Power of Attorney in Chart? No - copy requested No - copy requested   Would patient like information on creating a medical advance directive?   No - Patient declined    Current Medications (verified) Outpatient Encounter Medications as of 02/18/2024   Medication Sig   Calcium Citrate-Vitamin D (CALCIUM + D PO) Take by mouth daily.   folic acid (FOLVITE) 800 MCG tablet Take 800 mcg by mouth daily.   HUMIRA PEN 40 MG/0.4ML PNKT SMARTSIG:40 Milligram(s) SUB-Q Every 2 Weeks   methotrexate 2.5 MG tablet Take 10 mg by mouth once a week.   risedronate (ACTONEL) 5 MG tablet Take 1 tablet (5 mg total) by mouth daily before breakfast. with water on empty stomach, nothing by mouth or lie down for next 30 minutes.   benzonatate (TESSALON) 200 MG capsule Take 1 capsule (200 mg total) by mouth 3 (three) times daily as needed for cough.   pseudoephedrine (SUDAFED) 60 MG tablet Take 1 tablet (60 mg total) by mouth every 8 (eight) hours as needed for congestion.   No facility-administered encounter medications on file as of 02/18/2024.    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 Antony Madura in [place]   Shingles 02/2020   face   Past Surgical History:  Procedure Laterality Date   CERVICAL POLYPECTOMY  2003   TONSILLECTOMY  1958   TUBAL LIGATION     BTL   Family History  Problem Relation Age of Onset  Heart disease Mother    Hyperlipidemia Brother    Heart attack Brother    Heart disease Brother    Colon polyps Brother    Breast cancer Maternal Aunt    Heart disease Maternal Grandmother    Breast cancer Maternal Grandmother    Cancer - Lung Maternal Grandfather    Cancer Maternal Grandfather    Brain cancer Paternal Grandmother    Colon cancer Neg Hx    Esophageal cancer Neg Hx    Stomach cancer Neg Hx    Rectal cancer Neg Hx    Social  History   Socioeconomic History   Marital status: Married    Spouse name: Not on file   Number of children: Not on file   Years of education: Not on file   Highest education level: Not on file  Occupational History   Not on file  Tobacco Use   Smoking status: Never   Smokeless tobacco: Never  Vaping Use   Vaping status: Never Used  Substance and Sexual Activity   Alcohol use: Yes    Alcohol/week: 1.0 standard drink of alcohol    Types: 1 Glasses of wine per week    Comment: Occasionally   Drug use: No   Sexual activity: Yes    Birth control/protection: Post-menopausal    Comment: BTL  Other Topics Concern   Not on file  Social History Narrative   Not on file   Social Drivers of Health   Financial Resource Strain: Low Risk  (02/18/2024)   Overall Financial Resource Strain (CARDIA)    Difficulty of Paying Living Expenses: Not hard at all  Food Insecurity: No Food Insecurity (02/18/2024)   Hunger Vital Sign    Worried About Running Out of Food in the Last Year: Never true    Ran Out of Food in the Last Year: Never true  Transportation Needs: No Transportation Needs (02/18/2024)   PRAPARE - Administrator, Civil Service (Medical): No    Lack of Transportation (Non-Medical): No  Physical Activity: Sufficiently Active (02/18/2024)   Exercise Vital Sign    Days of Exercise per Week: 7 days    Minutes of Exercise per Session: 50 min  Stress: No Stress Concern Present (02/18/2024)   [employer] of Occupational Health - Occupational Stress Questionnaire    Feeling of Stress : Only a little  Social Connections: Moderately Integrated (02/18/2024)   Social Connection and Isolation Panel [NHANES]    Frequency of Communication with Friends and Family: Three times a week    Frequency of Social Gatherings with Friends and Family: More than three times a week    Attends Religious Services: More than 4 times per year    Active Member of [employer] or Organizations: No     Attends Banker Meetings: Never    Marital Status: Married    Tobacco Counseling Counseling given: Not Answered    Clinical Intake:  Pre-visit preparation completed: Yes  Pain : No/denies pain     Nutritional Risks: None Diabetes: No  Lab Results  Component Value Date   HGBA1C 5.5 04/27/2021   HGBA1C 5.1 03/14/2020     How often do you need to have someone help you when you read instructions, pamphlets, or other written materials from your doctor or pharmacy?: 1 - Never  Interpreter Needed?: No  Information entered by :: NAllen LPN   Activities of Daily Living     02/17/2024    9:59 AM  In  your present state of health, do you have any difficulty performing the following activities:  Hearing? 0  Vision? 0  Difficulty concentrating or making decisions? 0  Walking or climbing stairs? 0  Dressing or bathing? 0  Doing errands, shopping? 0  Preparing Food and eating ? N  Using the Toilet? N  In the past six months, have you accidently leaked urine? Y  Do you have problems with loss of bowel control? N  Managing your Medications? N  Managing your Finances? N  Housekeeping or managing your Housekeeping? N    Patient Care Team: Early, Sung Amabile, NP as PCP - General (Nurse Practitioner)  Indicate any recent Medical Services you may have received from other than [facility] providers in the past year (date may be approximate).     Assessment:   This is a routine wellness examination for Shadiamond.  Hearing/Vision screen Hearing Screening - Comments:: Denies hearing issues Vision Screening - Comments:: Regular eye exams, [facility]   Goals Addressed             This Visit's Progress    Patient Stated       02/18/2024, wants to lose weight and cut back on portions, take better care of self       Depression Screen     02/18/2024    4:12 PM 08/20/2023    2:31 PM 02/12/2023    3:46 PM 01/24/2023   10:21 AM 08/29/2022    2:18 PM 05/01/2022   10:37 AM  04/27/2021    9:42 AM  PHQ 2/9 Scores  PHQ - 2 Score 0 0 0 0 0 0 0  PHQ- 9 Score 0  0  1  0  Exception Documentation      Medical reason     Fall Risk     02/17/2024    9:59 AM 02/08/2023    4:44 PM 08/29/2022    2:18 PM 05/01/2022   10:36 AM 04/27/2021    9:42 AM  Fall Risk   Falls in the past year? 0 0 0 0 0  Number falls in past yr: 0 0 0 0 0  Injury with Fall? 0 0 0 0 0  Risk for fall due to : Medication side effect Medication side effect No Fall Risks Other (Comment) No Fall Risks  Follow up Falls prevention discussed;Falls evaluation completed Falls prevention discussed;Education provided;Falls evaluation completed Falls evaluation completed;Education provided Education provided;Falls evaluation completed Falls evaluation completed    MEDICARE RISK AT HOME:  Medicare Risk at Home Any stairs in or around the home?: (Patient-Rptd) Yes If so, are there any without handrails?: (Patient-Rptd) No Home free of loose throw rugs in walkways, pet beds, electrical cords, etc?: (Patient-Rptd) Yes Adequate lighting in your home to reduce risk of falls?: (Patient-Rptd) Yes Life alert?: (Patient-Rptd) No Use of a cane, walker or w/c?: (Patient-Rptd) No Grab bars in the bathroom?: (Patient-Rptd) Yes Shower chair or bench in shower?: (Patient-Rptd) No Elevated toilet seat or a handicapped toilet?: (Patient-Rptd) Yes  TIMED UP AND GO:  Was the test performed?  No  Cognitive Function: 6CIT completed        02/18/2024    4:13 PM 02/12/2023    3:47 PM  6CIT Screen  What Year? 0 points 0 points  What month? 0 points 0 points  What time? 0 points 0 points  Count back from 20 0 points 0 points  Months in reverse 0 points 0 points  Repeat phrase 0 points  0 points  Total Score 0 points 0 points    Immunizations Immunization History  Administered Date(s) Administered   Fluad Quad(high Dose 65+) 09/30/2022, 11/15/2023   Moderna SARS-COV2 Booster Vaccination 07/22/2020   Moderna  Sars-Covid-2 Vaccination 12/17/2019, 01/11/2020   Tdap 10/31/2006, 11/26/2012   Unspecified SARS-COV-2 Vaccination 10/25/2022, 01/03/2024    Screening Tests Health Maintenance  Topic Date Due   Zoster Vaccines- Shingrix (1 of 2) Never done   DTaP/Tdap/Td (3 - Td or Tdap) 11/26/2022   Pneumonia Vaccine 14+ Years old (1 of 2 - PCV) 05/28/2024 (Originally 11/24/1962)   COVID-19 Vaccine (5 - Mixed Product risk 2024-25 season) 07/02/2024   Medicare Annual Wellness (AWV)  02/17/2025   Fecal DNA (Cologuard)  05/18/2025   MAMMOGRAM  07/16/2025   INFLUENZA VACCINE  Completed   DEXA SCAN  Completed   Hepatitis C Screening  Completed   HPV VACCINES  Aged Out   Colonoscopy  Discontinued    Health Maintenance  Health Maintenance Due  Topic Date Due   Zoster Vaccines- Shingrix (1 of 2) Never done   DTaP/Tdap/Td (3 - Td or Tdap) 11/26/2022   Health Maintenance Items Addressed: Due for TDAP and shingrix  Additional Screening:  Vision Screening: Recommended annual ophthalmology exams for early detection of glaucoma and other disorders of the eye.  Dental Screening: Recommended annual dental exams for proper oral hygiene  Community Resource Referral / Chronic Care Management: CRR required this visit?  No   CCM required this visit?  No     Plan:     I have personally reviewed and noted the following in the patient's chart:   Medical and social history Use of alcohol, tobacco or illicit drugs  Current medications and supplements including opioid prescriptions. Patient is not currently taking opioid prescriptions. Functional ability and status Nutritional status Physical activity Advanced directives List of other physicians Hospitalizations, surgeries, and ER visits in previous 12 months Vitals Screenings to include cognitive, depression, and falls Referrals and appointments  In addition, I have reviewed and discussed with patient certain preventive protocols, quality  metrics, and best practice recommendations. A written personalized care plan for preventive services as well as general preventive health recommendations were provided to patient.     Barb Merino, LPN   1/61/0960   After Visit Summary: (MyChart) Due to this being a telephonic visit, the after visit summary with patients personalized plan was offered to patient via MyChart   Notes: Nothing significant to report at this time.

## 2024-02-18 NOTE — Patient Instructions (Signed)
 Jill Lara , Thank you for taking time to come for your Medicare Wellness Visit. I appreciate your ongoing commitment to your health goals. Please review the following plan we discussed and let me know if I can assist you in the future.   Referrals/Orders/Follow-Ups/Clinician Recommendations: none  This is a list of the screening recommended for you and due dates:  Health Maintenance  Topic Date Due   Zoster (Shingles) Vaccine (1 of 2) Never done   DTaP/Tdap/Td vaccine (3 - Td or Tdap) 11/26/2022   Pneumonia Vaccine (1 of 2 - PCV) 05/28/2024*   COVID-19 Vaccine (5 - Mixed Product risk 2024-25 season) 07/02/2024   Medicare Annual Wellness Visit  02/17/2025   Cologuard (Stool DNA test)  05/18/2025   Mammogram  07/16/2025   Flu Shot  Completed   DEXA scan (bone density measurement)  Completed   Hepatitis C Screening  Completed   HPV Vaccine  Aged Out   Colon Cancer Screening  Discontinued  *Topic was postponed. The date shown is not the original due date.    Advanced directives: (Copy Requested) Please bring a copy of your health care power of attorney and living will to the office to be added to your chart at your convenience. You can mail to [facility] [address] [place], [place] [address] or email to [email or web address]  Next Medicare Annual Wellness Visit scheduled for next year: Yes  insert Preventive Care attachment Insert FALL PREVENTION attachment if needed

## 2024-04-09 DIAGNOSIS — M0579 Rheumatoid arthritis with rheumatoid factor of multiple sites without organ or systems involvement: Secondary | ICD-10-CM | POA: Diagnosis not present

## 2024-06-02 ENCOUNTER — Encounter: Payer: Self-pay | Admitting: Nurse Practitioner

## 2024-06-02 ENCOUNTER — Ambulatory Visit: Payer: Medicare Other | Admitting: Nurse Practitioner

## 2024-06-02 VITALS — BP 128/84 | HR 78 | Ht 65.0 in | Wt 187.8 lb

## 2024-06-02 DIAGNOSIS — Z862 Personal history of diseases of the blood and blood-forming organs and certain disorders involving the immune mechanism: Secondary | ICD-10-CM

## 2024-06-02 DIAGNOSIS — M0579 Rheumatoid arthritis with rheumatoid factor of multiple sites without organ or systems involvement: Secondary | ICD-10-CM | POA: Diagnosis not present

## 2024-06-02 DIAGNOSIS — E782 Mixed hyperlipidemia: Secondary | ICD-10-CM

## 2024-06-02 DIAGNOSIS — Z0000 Encounter for general adult medical examination without abnormal findings: Secondary | ICD-10-CM

## 2024-06-02 DIAGNOSIS — M150 Primary generalized (osteo)arthritis: Secondary | ICD-10-CM | POA: Diagnosis not present

## 2024-06-02 DIAGNOSIS — D849 Immunodeficiency, unspecified: Secondary | ICD-10-CM

## 2024-06-02 DIAGNOSIS — Z6830 Body mass index (BMI) 30.0-30.9, adult: Secondary | ICD-10-CM | POA: Diagnosis not present

## 2024-06-02 LAB — LIPID PANEL

## 2024-06-02 MED ORDER — PREDNISONE 10 MG PO TABS
10.0000 mg | ORAL_TABLET | Freq: Every day | ORAL | 3 refills | Status: AC | PRN
Start: 2024-06-02 — End: ?

## 2024-06-02 NOTE — Progress Notes (Signed)
 Catheline Doing, DNP, AGNP-c [facility] [address] [place], [place] [address] Main Office [phone]  BP 128/84   Pulse 78   Ht 5' 5 (1.651 m)   Wt 187 lb 12.8 oz (85.2 kg)   LMP 09/26/2009   BMI 31.25 kg/m    AWV completed with [facility] 02/18/2024  Pneumococcal Vaccine- had 3-4 years ago at the pharmacy.  Tdap- must get from pharm Shingrix- must get from pharm- ask rheum  Subjective:    Patient ID: Jill Lara, female    DOB: December 26, 1955, 68 y.o.   MRN: [id number]  HPI: History of Present Illness Jill Lara is a 68 year old female with rheumatoid arthritis who presents for her annual Medicare wellness exam and concerns about weight gain.  She has gained 20 pounds over the past year and describes feeling 'blubbery and gushy.' She has a history of yo-yo dieting and finds it difficult to lose weight despite efforts to reduce caloric intake and increase exercise. Her previous weight loss strategies are no longer effective. Emotional eating affects her confidence and ability to tolerate heat. She is attempting to balance her diet and exercise routine.  She has rheumatoid arthritis and is under rheumatology care. Her medications include Humira and methotrexate. She uses prednisone  during flare-ups, which are infrequent, with the last use two months ago for a sciatica flare-up. She tapers the prednisone  dose over a few days when needed. Joint pain, exacerbated by weight gain, makes exercise challenging. She is trying to increase physical activity by walking but experiences difficulty due to joint pain and fatigue. She [place] but found [place] initially too strenuous.  She has received the flu shot and the latest COVID booster earlier this year. She has a history of shingles and is uncertain about the timing of the shingles vaccine while on Humira. She received the pneumonia vaccine three to four years ago at a pharmacy.  [place]. She experiences ankle swelling, which she attributes to rheumatoid arthritis and weight gain. No chest pain, shortness of breath, or difficulty swallowing.  Pertinent items are noted in HPI.  Most Recent Depression Screen:     06/02/2024    8:13 AM 02/18/2024    4:12 PM 08/20/2023    2:31 PM 02/12/2023    3:46 PM 01/24/2023   10:21 AM  Depression screen PHQ 2/9  Decreased Interest 0 0 0 0 0  Down, Depressed, Hopeless 0 0 0 0 0  PHQ - 2 Score 0 0 0 0 0  Altered sleeping  0  0   Tired, decreased energy  0  0   Change in appetite  0  0   Feeling bad or failure about yourself   0  0   Trouble concentrating  0  0   Moving slowly or fidgety/restless  0  0   Suicidal thoughts  0  0   PHQ-9 Score  0  0   Difficult doing work/chores  Not difficult at all  Not difficult at all    Most Recent Anxiety Screen:     04/27/2021    9:42 AM  GAD 7 : Generalized Anxiety Score  Nervous, Anxious, on Edge 0  Control/stop worrying 0  Worry too much - different things 0  Trouble relaxing 0  Restless 0  Easily annoyed or irritable 0  Afraid - awful might happen 0  Total GAD 7 Score 0  Most Recent Fall Screen:    06/02/2024    8:12 AM 02/17/2024    9:59 AM 02/08/2023    4:44 PM 08/29/2022    2:18 PM 05/01/2022   10:36 AM  Fall Risk   Falls in the past year? 0 0 0 0 0  Number falls in past yr: 0 0 0 0 0  Injury with Fall? 0 0 0 0 0  Risk for fall due to : No Fall Risks Medication side effect Medication side effect No Fall Risks Other (Comment)  Follow up Falls evaluation completed Falls prevention discussed;Falls evaluation completed Falls prevention discussed;Education provided;Falls evaluation completed Falls evaluation completed;Education provided  Education provided;Falls evaluation completed      Data saved with a previous flowsheet row definition    Past medical history, surgical history, medications, allergies, family history and social history reviewed with  patient today and changes made to appropriate areas of the chart.  Past Medical History:  Past Medical History:  Diagnosis Date   Allergy    See chart   Arthritis    Encounter to establish care 04/27/2021   Erythema nodosum 1980's   Moderate mixed hyperlipidemia not requiring statin therapy 05/29/2023   Osteopenia of both hips 07/30/2017   Osteoporosis    Rheumatoid aortitis    Dr. Tomie in [place]   Shingles 02/2020   face   Medications:  Current Outpatient Medications on File Prior to Visit  Medication Sig   Calcium Citrate-Vitamin D  (CALCIUM + D PO) Take by mouth daily.   folic acid (FOLVITE) 800 MCG tablet Take 800 mcg by mouth daily.   HUMIRA PEN 40 MG/0.4ML PNKT SMARTSIG:40 Milligram(s) SUB-Q Every 2 Weeks   methotrexate 2.5 MG tablet Take 10 mg by mouth once a week.   risedronate  (ACTONEL ) 5 MG tablet Take 1 tablet (5 mg total) by mouth daily before breakfast. with water on empty stomach, nothing by mouth or lie down for next 30 minutes.   No current facility-administered medications on file prior to visit.   Surgical History:  Past Surgical History:  Procedure Laterality Date   CERVICAL POLYPECTOMY  2003   TONSILLECTOMY  1958   TUBAL LIGATION     BTL   Allergies:  Allergies  Allergen Reactions   Yellow Jacket Venom Anaphylaxis, Hives and Shortness Of Breath   Levaquin [Levofloxacin In D5w] Other (See Comments)    tendonopathy    Macrobid [Nitrofurantoin Macrocrystal] Hives   Ciprofloxacin Rash   Family History:  Family History  Problem Relation Age of Onset   Heart disease Mother    Hyperlipidemia Brother    Heart attack Brother    Heart disease Brother    Colon polyps Brother    Breast cancer Maternal Aunt    Heart disease Maternal Grandmother    Breast cancer Maternal Grandmother    Cancer - Lung Maternal Grandfather    Cancer Maternal Grandfather    Brain cancer Paternal Grandmother    Colon cancer Neg Hx    Esophageal cancer Neg Hx     Stomach cancer Neg Hx    Rectal cancer Neg Hx        Objective:    BP 128/84   Pulse 78   Ht 5' 5 (1.651 m)   Wt 187 lb 12.8 oz (85.2 kg)   LMP 09/26/2009   BMI 31.25 kg/m   Wt Readings from Last 3 Encounters:  06/02/24 187 lb 12.8 oz (85.2 kg)  10/28/23 179 lb 3.2 oz (81.3 kg)  08/20/23 183 lb (83 kg)    Physical Exam Vitals and nursing note reviewed.  Constitutional:      General: She is not in acute distress.    Appearance: Normal appearance.  HENT:     Head: Normocephalic and atraumatic.     Right Ear: Hearing, tympanic membrane, ear canal and external ear normal.     Left Ear: Hearing, tympanic membrane, ear canal and external ear normal.     Nose: Nose normal.     Right Sinus: No maxillary sinus tenderness or frontal sinus tenderness.     Left Sinus: No maxillary sinus tenderness or frontal sinus tenderness.     Mouth/Throat:     Lips: Pink.     Mouth: Mucous membranes are moist.     Pharynx: Oropharynx is clear.  Eyes:     General: Lids are normal. Vision grossly intact.     Extraocular Movements: Extraocular movements intact.     Conjunctiva/sclera: Conjunctivae normal.     Pupils: Pupils are equal, round, and reactive to light.     Funduscopic exam:    Right eye: Red reflex present.        Left eye: Red reflex present.    Visual Fields: Right eye visual fields normal and left eye visual fields normal.  Neck:     Thyroid : No thyromegaly.     Vascular: No carotid bruit.  Cardiovascular:     Rate and Rhythm: Normal rate and regular rhythm.     Chest Wall: PMI is not displaced.     Pulses: Normal pulses.          Dorsalis pedis pulses are 2+ on the right side and 2+ on the left side.       Posterior tibial pulses are 2+ on the right side and 2+ on the left side.     Heart sounds: Normal heart sounds. No murmur heard. Pulmonary:     Effort: Pulmonary effort is normal. No respiratory distress.     Breath sounds: Normal breath sounds.  Abdominal:      General: Abdomen is flat. Bowel sounds are normal. There is no distension.     Palpations: Abdomen is soft. There is no hepatomegaly, splenomegaly or mass.     Tenderness: There is no abdominal tenderness. There is no right CVA tenderness, left CVA tenderness, guarding or rebound.  Musculoskeletal:        General: Tenderness present.     Cervical back: Full passive range of motion without pain, normal range of motion and neck supple. No tenderness.     Right lower leg: No edema.     Left lower leg: No edema.  Feet:     Left foot:     Toenail Condition: Left toenails are normal.  Lymphadenopathy:     Cervical: No cervical adenopathy.     Upper Body:     Right upper body: No supraclavicular adenopathy.     Left upper body: No supraclavicular adenopathy.  Skin:    General: Skin is warm and dry.     Capillary Refill: Capillary refill takes less than 2 seconds.     Nails: There is no clubbing.  Neurological:     General: No focal deficit present.     Mental Status: She is alert and oriented to person, place, and time.     GCS: GCS eye subscore is 4. GCS verbal subscore is 5. GCS motor subscore is 6.     Sensory: Sensation is intact.  Motor: Motor function is intact.     Coordination: Coordination is intact.     Gait: Gait is intact.     Deep Tendon Reflexes: Reflexes are normal and symmetric.  Psychiatric:        Attention and Perception: Attention normal.        Mood and Affect: Mood normal.        Speech: Speech normal.        Behavior: Behavior normal. Behavior is cooperative.        Thought Content: Thought content normal.        Cognition and Memory: Cognition and memory normal.        Judgment: Judgment normal.      Results for orders placed or performed in visit on 06/02/24  CBC with Differential/Platelet   Collection Time: 06/02/24  9:37 AM  Result Value Ref Range   WBC 5.5 3.4 - 10.8 x10E3/uL   RBC 4.35 3.77 - 5.28 x10E6/uL   Hemoglobin 13.3 11.1 - 15.9 g/dL    Hematocrit 59.0 65.9 - 46.6 %   MCV 94 79 - 97 fL   MCH 30.6 26.6 - 33.0 pg   MCHC 32.5 31.5 - 35.7 g/dL   RDW 87.1 88.2 - 84.5 %   Platelets 279 150 - 450 x10E3/uL   Neutrophils 49 Not Estab. %   Lymphs 41 Not Estab. %   Monocytes 5 Not Estab. %   Eos 5 Not Estab. %   Basos 0 Not Estab. %   Neutrophils Absolute 2.7 1.4 - 7.0 x10E3/uL   Lymphocytes Absolute 2.3 0.7 - 3.1 x10E3/uL   Monocytes Absolute 0.3 0.1 - 0.9 x10E3/uL   EOS (ABSOLUTE) 0.3 0.0 - 0.4 x10E3/uL   Basophils Absolute 0.0 0.0 - 0.2 x10E3/uL   Immature Granulocytes 0 Not Estab. %   Immature Grans (Abs) 0.0 0.0 - 0.1 x10E3/uL  Comprehensive metabolic panel with GFR   Collection Time: 06/02/24  9:37 AM  Result Value Ref Range   Glucose 90 70 - 99 mg/dL   BUN 18 8 - 27 mg/dL   Creatinine, Ser 9.26 0.57 - 1.00 mg/dL   eGFR 89 >40 fO/fpw/8.26   BUN/Creatinine Ratio 25 12 - 28   Sodium 142 134 - 144 mmol/L   Potassium 4.3 3.5 - 5.2 mmol/L   Chloride 106 96 - 106 mmol/L   CO2 21 20 - 29 mmol/L   Calcium 9.4 8.7 - 10.3 mg/dL   Total Protein 6.8 6.0 - 8.5 g/dL   Albumin 4.5 3.9 - 4.9 g/dL   Globulin, Total 2.3 1.5 - 4.5 g/dL   Bilirubin Total 0.6 0.0 - 1.2 mg/dL   Alkaline Phosphatase 45 44 - 121 IU/L   AST 22 0 - 40 IU/L   ALT 19 0 - 32 IU/L  Lipid panel   Collection Time: 06/02/24  9:37 AM  Result Value Ref Range   Cholesterol, Total 211 (H) 100 - 199 mg/dL   Triglycerides 895 0 - 149 mg/dL   HDL 56 >60 mg/dL   VLDL Cholesterol Cal 19 5 - 40 mg/dL   LDL Chol Calc (NIH) 863 (H) 0 - 99 mg/dL   Chol/HDL Ratio 3.8 0.0 - 4.4 ratio  VITAMIN D  25 Hydroxy (Vit-D Deficiency, Fractures)   Collection Time: 06/02/24  9:37 AM  Result Value Ref Range   Vit D, 25-Hydroxy 40.0 30.0 - 100.0 ng/mL  Hemoglobin A1c   Collection Time: 06/02/24  9:37 AM  Result Value Ref Range   Hgb  A1c MFr Bld 5.5 4.8 - 5.6 %   Est. average glucose Bld gHb Est-mCnc 111 mg/dL       Assessment & Plan:   Problem List Items Addressed This  Visit     Rheumatoid arthritis (HCC)   Rheumatoid arthritis managed with Humira and methotrexate under rheumatology care. Occasional use of low-dose prednisone  for flare-ups. Discussed potential impact of RA on weight and joint pain. Discussed the need to consult with rheumatology regarding the timing of vaccinations, particularly the shingles vaccine, while on Humira. - Send in a refill for low-dose prednisone  as needed for flare-ups - Consult with rheumatologist regarding shingles vaccine while on Humira      Relevant Medications   predniSONE  (DELTASONE ) 10 MG tablet   History of decreased platelet count   Repeat labs today.       Relevant Orders   CBC with Differential/Platelet (Completed)   Encounter for annual physical exam - Primary   CPE completed today. Review of HM activities and recommendations discussed and provided on AVS. Anticipatory guidance, diet, and exercise recommendations provided. Medications, allergies, and hx reviewed and updated as necessary. Orders placed as listed below.  Plan: - Labs ordered. Will make changes as necessary based on results.  - I will review these results and send recommendations via MyChart or a telephone call.  - F/U with CPE in 1 year or sooner for acute/chronic health needs as directed.        Primary osteoarthritis   Monitor labs for vitamin D  and calcium.       Relevant Medications   predniSONE  (DELTASONE ) 10 MG tablet   Other Relevant Orders   Comprehensive metabolic panel with GFR (Completed)   VITAMIN D  25 Hydroxy (Vit-D Deficiency, Fractures) (Completed)   Moderate mixed hyperlipidemia not requiring statin therapy   Repeat evaluation today. Diet and exercise management recommended.       Relevant Orders   CBC with Differential/Platelet (Completed)   Comprehensive metabolic panel with GFR (Completed)   Lipid panel (Completed)   Hemoglobin A1c (Completed)   BMI 30.0-30.9,adult   Reported a 20-pound weight gain since last  year. Challenges with weight management include difficulty with exercise due to rheumatoid arthritis and joint pain. Explored dietary habits and exercise routines. Emphasized the importance of a balanced diet, portion control, and gradual increase in physical activity. Discussed potential impact of medications and emotional eating on weight. Provided strategies for weight management, including using smaller plates, balancing protein and carbohydrates, and monitoring hidden calories in sauces and drinks. Encouraged a lifestyle change rather than a temporary diet. - Provide handouts on high protein, high fiber, low carbohydrate snack ideas and sample meal plans - Encourage gradual increase in physical activity - Monitor weight and dietary habits - Consider referral to Healthy [facility] and Wellness service if needed      Relevant Orders   CBC with Differential/Platelet (Completed)   Comprehensive metabolic panel with GFR (Completed)   Lipid panel (Completed)   Hemoglobin A1c (Completed)   Immunosuppression (HCC)   On humira for management of RA. Recommend infection precautions.       Relevant Orders   CBC with Differential/Platelet (Completed)      Follow up plan: Return in about 1 year (around 06/02/2025) for CPE AWV with [facility]ke[facility].  NEXT PREVENTATIVE PHYSICAL DUE IN 1 YEAR.  PATIENT COUNSELING PROVIDED FOR ALL ADULT PATIENTS: A well balanced diet low in saturated fats, cholesterol, and moderation in carbohydrates.  This can be as simple as monitoring portion  sizes and cutting back on sugary beverages such as soda and juice to start with.    Daily water consumption of at least 64 ounces.  Physical activity at least 180 minutes per week.  If just starting out, start 10 minutes a day and work your way up.   This can be as simple as taking the stairs instead of the elevator and walking 2-3 laps around the office  purposefully every day.   STD protection, partner selection, and regular  testing if high risk.  Limited consumption of alcoholic beverages if alcohol is consumed. For men, I recommend no more than 14 alcoholic beverages per week, spread out throughout the week (max 2 per day). Avoid binge drinking or consuming large quantities of alcohol in one setting.  Please let me know if you feel you may need help with reduction or quitting alcohol consumption.   Avoidance of nicotine, if used. Please let me know if you feel you may need help with reduction or quitting nicotine use.   Daily mental health attention. This can be in the form of 5 minute daily meditation, prayer, journaling, yoga, reflection, etc.  Purposeful attention to your emotions and mental state can significantly improve your overall wellbeing  and  Health.  Please know that I am here to help you with all of your health care goals and am happy to work with you to find a solution that works best for you.  The greatest advice I have received with any changes in life are to take it one step at a time, that even means if all you can focus on is the next 60 seconds, then do that and celebrate your victories.  With any changes in life, you will have set backs, and that is OK. The important thing to remember is, if you have a set back, it is not a failure, it is an opportunity to try again! Screening Testing Mammogram Every 1 -2 years based on history and risk factors Starting at age 19 Pap Smear Ages 21-39 every 3 years Ages 29-65 every 5 years with HPV testing More frequent testing may be required based on results and history Colon Cancer Screening Every 1-10 years based on test performed, risk factors, and history Starting at age 33 Bone Density Screening Every 2-10 years based on history Starting at age 41 for women Recommendations for men differ based on medication usage, history, and risk factors AAA Screening One time ultrasound Men 39-79 years old who have every smoked Lung Cancer  Screening Low Dose Lung CT every 12 months Age 28-80 years with a 30 pack-year smoking history who still smoke or who have quit within the last 15 years   Screening Labs Routine  Labs: Complete Blood Count (CBC), Complete Metabolic Panel (CMP), Cholesterol (Lipid Panel) Every 6-12 months based on history and medications May be recommended more frequently based on current conditions or previous results Hemoglobin A1c Lab Every 3-12 months based on history and previous results Starting at age 64 or earlier with diagnosis of diabetes, high cholesterol, BMI >26, and/or risk factors Frequent monitoring for patients with diabetes to ensure blood sugar control Thyroid  Panel (TSH) Every 6 months based on history, symptoms, and risk factors May be repeated more often if on medication HIV One time testing for all patients 68 and older May be repeated more frequently for patients with increased risk factors or exposure Hepatitis C One time testing for all patients 67 and older May be repeated more frequently  for patients with increased risk factors or exposure Gonorrhea, Chlamydia Every 12 months for all sexually active persons 13-24 years Additional monitoring may be recommended for those who are considered high risk or who have symptoms Every 12 months for any woman on birth control, regardless of sexual activity PSA Men 41-47 years old with risk factors Additional screening may be recommended from age 60-69 based on risk factors, symptoms, and history  Vaccine Recommendations Tetanus Booster All adults every 10 years Flu Vaccine All patients 6 months and older every year COVID Vaccine All patients 12 years and older Initial dosing with booster May recommend additional booster based on age and health history HPV Vaccine 2 doses all patients age 15-26 Dosing may be considered for patients over 26 Shingles Vaccine (Shingrix) 2 doses all adults 55 years and older Pneumonia (Pneumovax  90) All adults 65 years and older May recommend earlier dosing based on health history One year apart from Prevnar 31 Pneumonia (Prevnar 69) All adults 65 years and older Dosed 1 year after Pneumovax 23 Pneumonia (Prevnar 20) One time alternative to the two dosing of 13 and 23 For all adults with initial dose of 23, 20 is recommended 1 year later For all adults with initial dose of 13, 23 is still recommended as second option 1 year later

## 2024-06-02 NOTE — Patient Instructions (Addendum)
 WEIGHT LOSS PLANNING Your progress today shows:     06/02/2024    8:14 AM 02/18/2024    4:05 PM 10/28/2023    2:02 PM  Vitals with BMI  Height 5' 5 --   Weight 187 lbs 13 oz -- 179 lbs 3 oz  BMI 31.25    Systolic 128 -- 110  Diastolic 84 -- 70  Pulse 78  72    For best management of weight, it is vital to balance intake versus output. This means the number of calories burned per day must be less than the calories you take in with food and drink.   I recommend trying to follow a diet with the following: Calories: 1200-1500 calories per day Carbohydrates: 150-180 grams of carbohydrates per day  Why: Gives your body enough quick fuel for cells to maintain normal function without sending them into starvation mode.  Protein: At least 90 grams of protein per day- 30 grams with each meal Why: Protein takes longer and uses more energy than carbohydrates to break down for fuel. The carbohydrates in your meals serves as quick energy sources and proteins help use some of that extra quick energy to break down to produce long term energy. This helps you not feel hungry as quickly and protein breakdown burns calories.  Water: Drink AT LEAST 64 ounces of water per day  Why: Water is essential to healthy metabolism. Water helps to fill the stomach and keep you fuller longer. Water is required for healthy digestion and filtering of waste in the body.  Fat: Limit fats in your diet- when choosing fats, choose foods with lower fats content such as lean meats (chicken, fish, [place]).  Why: Increased fat intake leads to storage for later. Once you burn your carbohydrate energy, your body goes into fat and protein breakdown mode to help you loose weight.  Cholesterol: Fats and oils that are LIQUID at room temperature are best. Choose vegetable oils (olive oil, avocado oil, nuts). Avoid fats that are SOLID at room temperature (animal fats, processed meats). Healthy fats are often found in whole grains, beans,  nuts, seeds, and berries.  Why: Elevated cholesterol levels lead to build up of cholesterol on the inside of your blood vessels. This will eventually cause the blood vessels to become hard and can lead to high blood pressure and damage to your organs. When the blood flow is reduced, but the pressure is high from cholesterol buildup, parts of the cholesterol can break off and form clots that can go to the brain or heart leading to a stroke or heart attack.  Fiber: Increase amount of SOLUBLE the fiber in your diet. This helps to fill you up, lowers cholesterol, and helps with digestion. Some foods high in soluble fiber are oats, peas, beans, apples, carrots, barley, and citrus fruits.   Why: Fiber fills you up, helps remove excess cholesterol, and aids in healthy digestion which are all very important in weight management.   I recommend the following as a minimum activity routine: Purposeful walk or other physical activity at least 20 minutes every single day. This means purposefully taking a walk, jog, bike, swim, treadmill, elliptical, dance, etc.  This activity should be ABOVE your normal daily activities, such as walking at work. Goal exercise should be at least 150 minutes a week- work your way up to this.   Heart Rate: Your maximum exercise heart rate should be 220 - Your Age in Years. When exercising, get your heart rate up,  but avoid going over the maximum targeted heart rate.  60-70% of your maximum heart rate is where you tend to burn the most fat. To find this number:  220 - Age In Years= Max HR  Max HR x 0.6 (or 0.7) = Fat Burning HR The Fat Burning HR is your goal heart rate while working out to burn the most fat.  NEVER exercise to the point your feel lightheaded, weak, nauseated, dizzy. If you experience ANY of these symptoms- STOP exercise! Allow yourself to cool down and your heart rate to come down. Then restart slower next time.  If at ANY TIME you feel chest pain or chest  pressure during exercise, STOP IMMEDIATELY and seek medical attention.

## 2024-06-03 LAB — CBC WITH DIFFERENTIAL/PLATELET
Basophils Absolute: 0 x10E3/uL (ref 0.0–0.2)
Basos: 0 %
EOS (ABSOLUTE): 0.3 x10E3/uL (ref 0.0–0.4)
Eos: 5 %
Hematocrit: 40.9 % (ref 34.0–46.6)
Hemoglobin: 13.3 g/dL (ref 11.1–15.9)
Immature Grans (Abs): 0 x10E3/uL (ref 0.0–0.1)
Immature Granulocytes: 0 %
Lymphocytes Absolute: 2.3 x10E3/uL (ref 0.7–3.1)
Lymphs: 41 %
MCH: 30.6 pg (ref 26.6–33.0)
MCHC: 32.5 g/dL (ref 31.5–35.7)
MCV: 94 fL (ref 79–97)
Monocytes Absolute: 0.3 x10E3/uL (ref 0.1–0.9)
Monocytes: 5 %
Neutrophils Absolute: 2.7 x10E3/uL (ref 1.4–7.0)
Neutrophils: 49 %
Platelets: 279 x10E3/uL (ref 150–450)
RBC: 4.35 x10E6/uL (ref 3.77–5.28)
RDW: 12.8 % (ref 11.7–15.4)
WBC: 5.5 x10E3/uL (ref 3.4–10.8)

## 2024-06-03 LAB — COMPREHENSIVE METABOLIC PANEL WITH GFR
ALT: 19 IU/L (ref 0–32)
AST: 22 IU/L (ref 0–40)
Albumin: 4.5 g/dL (ref 3.9–4.9)
Alkaline Phosphatase: 45 IU/L (ref 44–121)
BUN/Creatinine Ratio: 25 (ref 12–28)
BUN: 18 mg/dL (ref 8–27)
Bilirubin Total: 0.6 mg/dL (ref 0.0–1.2)
CO2: 21 mmol/L (ref 20–29)
Calcium: 9.4 mg/dL (ref 8.7–10.3)
Chloride: 106 mmol/L (ref 96–106)
Creatinine, Ser: 0.73 mg/dL (ref 0.57–1.00)
Globulin, Total: 2.3 g/dL (ref 1.5–4.5)
Glucose: 90 mg/dL (ref 70–99)
Potassium: 4.3 mmol/L (ref 3.5–5.2)
Sodium: 142 mmol/L (ref 134–144)
Total Protein: 6.8 g/dL (ref 6.0–8.5)
eGFR: 89 mL/min/1.73 (ref >59)

## 2024-06-03 LAB — LIPID PANEL
Cholesterol, Total: 211 mg/dL — AB (ref 100–199)
HDL: 56 mg/dL (ref >39)
LDL CALC COMMENT:: 3.8 ratio (ref 0.0–4.4)
LDL Chol Calc (NIH): 136 mg/dL — AB (ref 0–99)
Triglycerides: 104 mg/dL (ref 0–149)
VLDL Cholesterol Cal: 19 mg/dL (ref 5–40)

## 2024-06-03 LAB — VITAMIN D 25 HYDROXY (VIT D DEFICIENCY, FRACTURES): Vit D, 25-Hydroxy: 40 ng/mL (ref 30.0–100.0)

## 2024-06-03 LAB — HEMOGLOBIN A1C
Est. average glucose Bld gHb Est-mCnc: 111 mg/dL
Hgb A1c MFr Bld: 5.5 (ref 4.8–5.6)

## 2024-06-11 DIAGNOSIS — R7989 Other specified abnormal findings of blood chemistry: Secondary | ICD-10-CM | POA: Diagnosis not present

## 2024-06-11 DIAGNOSIS — M25551 Pain in right hip: Secondary | ICD-10-CM | POA: Diagnosis not present

## 2024-06-11 DIAGNOSIS — M1991 Primary osteoarthritis, unspecified site: Secondary | ICD-10-CM | POA: Diagnosis not present

## 2024-06-11 DIAGNOSIS — M0579 Rheumatoid arthritis with rheumatoid factor of multiple sites without organ or systems involvement: Secondary | ICD-10-CM | POA: Diagnosis not present

## 2024-06-11 DIAGNOSIS — M810 Age-related osteoporosis without current pathological fracture: Secondary | ICD-10-CM | POA: Diagnosis not present

## 2024-06-17 ENCOUNTER — Ambulatory Visit: Payer: Self-pay | Admitting: Nurse Practitioner

## 2024-06-22 NOTE — Assessment & Plan Note (Signed)
 Rheumatoid arthritis managed with Humira and methotrexate under rheumatology care. Occasional use of low-dose prednisone  for flare-ups. Discussed potential impact of RA on weight and joint pain. Discussed the need to consult with rheumatology regarding the timing of vaccinations, particularly the shingles vaccine, while on Humira. - Send in a refill for low-dose prednisone  as needed for flare-ups - Consult with rheumatologist regarding shingles vaccine while on Humira

## 2024-06-22 NOTE — Assessment & Plan Note (Signed)
 On humira for management of RA. Recommend infection precautions.

## 2024-06-22 NOTE — Assessment & Plan Note (Signed)

## 2024-06-22 NOTE — Assessment & Plan Note (Signed)
 Repeat evaluation today. Diet and exercise management recommended.

## 2024-06-22 NOTE — Assessment & Plan Note (Signed)
 Monitor labs for vitamin D  and calcium.

## 2024-06-22 NOTE — Assessment & Plan Note (Signed)
 Reported a 20-pound weight gain since last year. Challenges with weight management include difficulty with exercise due to rheumatoid arthritis and joint pain. Explored dietary habits and exercise routines. Emphasized the importance of a balanced diet, portion control, and gradual increase in physical activity. Discussed potential impact of medications and emotional eating on weight. Provided strategies for weight management, including using smaller plates, balancing protein and carbohydrates, and monitoring hidden calories in sauces and drinks. Encouraged a lifestyle change rather than a temporary diet. - Provide handouts on high protein, high fiber, low carbohydrate snack ideas and sample meal plans - Encourage gradual increase in physical activity - Monitor weight and dietary habits - Consider referral to [facility] if needed

## 2024-06-22 NOTE — Assessment & Plan Note (Signed)
 Repeat labs today

## 2024-08-05 ENCOUNTER — Other Ambulatory Visit: Payer: Self-pay | Admitting: Obstetrics & Gynecology

## 2024-08-05 DIAGNOSIS — Z1231 Encounter for screening mammogram for malignant neoplasm of breast: Secondary | ICD-10-CM

## 2024-08-14 ENCOUNTER — Ambulatory Visit
Admission: RE | Admit: 2024-08-14 | Discharge: 2024-08-14 | Disposition: A | Discharge status: Home / Self Care | Source: Ambulatory Visit | Attending: Obstetrics & Gynecology | Admitting: Obstetrics & Gynecology

## 2024-08-14 DIAGNOSIS — Z1231 Encounter for screening mammogram for malignant neoplasm of breast: Secondary | ICD-10-CM

## 2024-08-16 ENCOUNTER — Other Ambulatory Visit (HOSPITAL_BASED_OUTPATIENT_CLINIC_OR_DEPARTMENT_OTHER): Payer: Self-pay | Admitting: Obstetrics & Gynecology

## 2024-08-16 DIAGNOSIS — M810 Age-related osteoporosis without current pathological fracture: Secondary | ICD-10-CM

## 2024-08-26 ENCOUNTER — Encounter (HOSPITAL_BASED_OUTPATIENT_CLINIC_OR_DEPARTMENT_OTHER): Payer: Self-pay | Admitting: Obstetrics & Gynecology

## 2024-09-08 ENCOUNTER — Telehealth (HOSPITAL_BASED_OUTPATIENT_CLINIC_OR_DEPARTMENT_OTHER): Payer: Self-pay

## 2024-09-08 DIAGNOSIS — M810 Age-related osteoporosis without current pathological fracture: Secondary | ICD-10-CM

## 2024-09-08 NOTE — Telephone Encounter (Signed)
 Spoke with patient. Patient would like an order for a BMD placed to [facility]. [name] last BMD was 07/17/2023. Advised BMD are recommended every 2 hours. Patient states she would feel more comfortable having this again this year. States she spoke with her insurance company and it will be covered. Advised I will place an order for her BMD over to [facility]. Patient is agreeable.

## 2024-09-08 NOTE — Telephone Encounter (Signed)
 Patient would like for Dr. Cleotilde to put in an order to have a bone density test and she would like to have it done at the [facility] in [place].

## 2024-09-17 ENCOUNTER — Encounter (HOSPITAL_BASED_OUTPATIENT_CLINIC_OR_DEPARTMENT_OTHER): Payer: Self-pay | Admitting: Obstetrics & Gynecology

## 2024-09-17 ENCOUNTER — Ambulatory Visit (HOSPITAL_BASED_OUTPATIENT_CLINIC_OR_DEPARTMENT_OTHER): Admitting: Obstetrics & Gynecology

## 2024-09-17 VITALS — BP 127/79 | HR 69 | Wt 183.2 lb

## 2024-09-17 DIAGNOSIS — T887XXA Unspecified adverse effect of drug or medicament, initial encounter: Secondary | ICD-10-CM

## 2024-09-17 DIAGNOSIS — M810 Age-related osteoporosis without current pathological fracture: Secondary | ICD-10-CM

## 2024-09-17 NOTE — Progress Notes (Signed)
   GYNECOLOGY  VISIT  CC:   Follow-up and Medication Problem  HPI: 68 y.o. G48P0011 [name] here to discuss medications for osteoporosis. Patient was not doing well on Risedronate . Reports intermittent jaw pain, leg pain, dark stool, and daily burning in her esophagus.  Reflux symptoms became daily and changes in diet didn't help.  She called with symptoms and I recommended stopping.  She's been off a few weeks and reflux has significantly improved.  Joint pain is some better but she does have RA so it's hard to tell.  She feels the symptoms are definitively better.  Also, stool color changes have improved.  H/o colonoscopy in 2023.   Other options for treatment discussed.  T score for spine was -3.5 so I do think she needs treatment.  She uses deltasone  intermittently for her RA so increased risks for infection with prolia is concerning to me.  She does not want to do a daily injection like with forteo. Reclast discussed.  Side effect reviewed including joint pain/body pain, osteonecrosis, atypical fractures.  Will order at infusion center.  Takiing 800mg  calcium with Vit D.  Had normal CMP (calcium and renal function) in July, 2025.  Do not recommend repeating at this time.    [name] last menstrual period was 09/26/2009.  Past Medical History:  Diagnosis Date   Allergy    See chart   Arthritis    Encounter to establish care 04/27/2021   Erythema nodosum 1980's   Moderate mixed hyperlipidemia not requiring statin therapy 05/29/2023   Osteopenia of both hips 07/30/2017   Osteoporosis    Rheumatoid aortitis    Dr. Tomie in [place]   Shingles 02/2020   face    MEDS:  Reviewed in EPIC  ALLERGIES: Yellow jacket venom, Levaquin [levofloxacin in d5w], Macrobid [nitrofurantoin macrocrystal], and Ciprofloxacin  SH:  married, non smoker  Review of Systems  Constitutional: Negative.   Genitourinary: Negative.     PHYSICAL EXAMINATION:    BP 127/79 (BP  Location: Left Arm, Patient Position: Sitting, Cuff Size: Normal)   Pulse 69   Wt 183 lb 3.2 oz (83.1 kg)   LMP 09/26/2009   SpO2 100%   BMI 30.49 kg/m     Physical Exam Constitutional:      Appearance: Normal appearance.  Neurological:     General: No focal deficit present.     Mental Status: She is alert.  Psychiatric:        Mood and Affect: Mood normal.        Behavior: Behavior normal.      Assessment/Plan: 1. Osteoporosis of lumbar spine (Primary) - after discussion, decided to proceed with infusion of Reclast - she will stay on her calcium and Vit D - Repeat Dexa in one year.    2. Medication side effect - pt had reflux with oral medication.  Much improved off this.    Total time with pt and documentation:  21 minutes

## 2024-09-17 NOTE — Patient Instructions (Signed)
 Call [phone] to schedule an appointment at [facility] to schedule the bone density.

## 2024-09-21 ENCOUNTER — Telehealth: Payer: Self-pay

## 2024-09-21 NOTE — Telephone Encounter (Signed)
 Dr. Cleotilde, patient will be scheduled as soon as possible.  Auth Submission: APPROVED Site of care: Site of care: [facility] Payer: [employer] medicare Medication & CPT/J Code(s) submitted: Reclast (Zolendronic acid) [id number] Diagnosis Code:  Route of submission (phone, fax, portal): portal Phone # Fax # Auth type: Buy/Bill PB Units/visits requested: 5mg  x 1 dose Reference number: [id number] Approval from: 09/21/24 to 09/21/25

## 2024-10-01 ENCOUNTER — Ambulatory Visit

## 2024-10-01 VITALS — BP 106/71 | HR 67 | Temp 97.4°F | Resp 16 | Ht 65.0 in | Wt 183.6 lb

## 2024-10-01 DIAGNOSIS — M810 Age-related osteoporosis without current pathological fracture: Secondary | ICD-10-CM

## 2024-10-01 MED ORDER — DIPHENHYDRAMINE HCL 25 MG PO CAPS
25.0000 mg | ORAL_CAPSULE | Freq: Once | ORAL | Status: AC
  Administered 2024-10-01: 25 mg via ORAL
  Filled 2024-10-01: qty 1

## 2024-10-01 MED ORDER — ACETAMINOPHEN 325 MG PO TABS
650.0000 mg | ORAL_TABLET | Freq: Once | ORAL | Status: DC
  Filled 2024-10-01: qty 2

## 2024-10-01 MED ORDER — ZOLEDRONIC ACID 5 MG/100ML IV SOLN
5.0000 mg | Freq: Once | INTRAVENOUS | Status: AC
  Administered 2024-10-01: 5 mg via INTRAVENOUS
  Filled 2024-10-01: qty 100

## 2024-10-01 NOTE — Progress Notes (Signed)
 Diagnosis: Osteoporosis  Provider:  Lonna Coder MD  Procedure: IV Infusion  IV Type: Peripheral, IV Location: L Antecubital  Reclast (Zolendronic Acid), Dose: 5 mg  Infusion Start Time: 1000  Infusion Stop Time: 1032  Post Infusion IV Care: Observation period completed and Peripheral IV Discontinued  Discharge: Condition: Good, Destination: Home . AVS Declined  Performed by:  Leita FORBES Miles, LPN

## 2024-12-07 ENCOUNTER — Telehealth: Payer: Self-pay | Admitting: Nurse Practitioner

## 2024-12-07 ENCOUNTER — Other Ambulatory Visit: Payer: Self-pay

## 2024-12-07 DIAGNOSIS — M05379 Rheumatoid heart disease with rheumatoid arthritis of unspecified ankle and foot: Secondary | ICD-10-CM

## 2024-12-07 NOTE — Telephone Encounter (Signed)
 Copied from CRM #[id number]. Topic: General - Other >> Dec 07, 2024  8:32 AM Wess RAMAN wrote: Reason for CRM: Kaylee from [facility] stated she needed a [employer] referral uploaded for patient.  Diagnosis code: M05.379 Provider: Lynwood Jetty MD, NPI: [id number]  Callback #: [id number]

## 2025-01-25 ENCOUNTER — Ambulatory Visit: Admitting: Nurse Practitioner

## 2025-02-23 ENCOUNTER — Ambulatory Visit: Payer: Self-pay

## 2025-04-15 ENCOUNTER — Other Ambulatory Visit (HOSPITAL_BASED_OUTPATIENT_CLINIC_OR_DEPARTMENT_OTHER)
